# Patient Record
Sex: Female | Born: 1995 | Race: Black or African American | Hispanic: No | Marital: Single | Smoking: Never smoker
Health system: Southern US, Community
[De-identification: ages and names within clinical notes are randomized; demographics above are authoritative.]

## PROBLEM LIST (undated history)

## (undated) DIAGNOSIS — Z789 Other specified health status: Secondary | ICD-10-CM

## (undated) HISTORY — PX: NO PAST SURGERIES: SHX2092

## (undated) HISTORY — DX: Other specified health status: Z78.9

---

## 2016-01-19 ENCOUNTER — Emergency Department
Admission: EM | Admit: 2016-01-19 | Discharge: 2016-01-19 | Disposition: A | Discharge status: Home / Self Care | Payer: BLUE CROSS/BLUE SHIELD | Attending: Emergency Medicine | Admitting: Emergency Medicine

## 2016-01-19 ENCOUNTER — Telehealth: Payer: Self-pay | Admitting: Emergency Medicine

## 2016-01-19 DIAGNOSIS — B373 Candidiasis of vulva and vagina: Secondary | ICD-10-CM | POA: Diagnosis not present

## 2016-01-19 DIAGNOSIS — N898 Other specified noninflammatory disorders of vagina: Secondary | ICD-10-CM | POA: Diagnosis present

## 2016-01-19 DIAGNOSIS — B3731 Acute candidiasis of vulva and vagina: Secondary | ICD-10-CM

## 2016-01-19 LAB — URINALYSIS COMPLETE WITH MICROSCOPIC (ARMC ONLY)
Bilirubin Urine: NEGATIVE
Glucose, UA: NEGATIVE mg/dL
Hgb urine dipstick: NEGATIVE
Ketones, ur: NEGATIVE mg/dL
Nitrite: NEGATIVE
Protein, ur: NEGATIVE mg/dL
Specific Gravity, Urine: 1.004 — ABNORMAL LOW (ref 1.005–1.030)
pH: 6 (ref 5.0–8.0)

## 2016-01-19 LAB — WET PREP, GENITAL
Clue Cells Wet Prep HPF POC: NONE SEEN
Sperm: NONE SEEN
Trich, Wet Prep: NONE SEEN

## 2016-01-19 LAB — CHLAMYDIA/NGC RT PCR (ARMC ONLY)
Chlamydia Tr: DETECTED — AB
N gonorrhoeae: NOT DETECTED

## 2016-01-19 LAB — POCT PREGNANCY, URINE: Preg Test, Ur: NEGATIVE

## 2016-01-19 MED ORDER — FLUCONAZOLE 50 MG PO TABS
150.0000 mg | ORAL_TABLET | Freq: Every day | ORAL | Status: DC
  Administered 2016-01-19: 150 mg via ORAL

## 2016-01-19 MED ORDER — FLUCONAZOLE 100 MG PO TABS
ORAL_TABLET | ORAL | Status: AC
  Filled 2016-01-19: qty 1

## 2016-01-19 MED ORDER — FLUCONAZOLE 150 MG PO TABS: 150.0000 mg | ORAL_TABLET | Freq: Every day | ORAL | Status: DC

## 2016-01-19 MED ORDER — FLUCONAZOLE 50 MG PO TABS
ORAL_TABLET | ORAL | Status: AC
  Filled 2016-01-19: qty 1

## 2016-01-19 NOTE — Discharge Instructions (Signed)
Vaginitis °Vaginitis is an inflammation of the vagina. It is most often caused by a change in the normal balance of the bacteria and yeast that live in the vagina. This change in balance causes an overgrowth of certain bacteria or yeast, which causes the inflammation. There are different types of vaginitis, but the most common types are: °· Bacterial vaginosis. °· Yeast infection (candidiasis). °· Trichomoniasis vaginitis. This is a sexually transmitted infection (STI). °· Viral vaginitis. °· Atrophic vaginitis. °· Allergic vaginitis. °CAUSES  °The cause depends on the type of vaginitis. Vaginitis can be caused by: °· Bacteria (bacterial vaginosis). °· Yeast (yeast infection). °· A parasite (trichomoniasis vaginitis) °· A virus (viral vaginitis). °· Low hormone levels (atrophic vaginitis). Low hormone levels can occur during pregnancy, breastfeeding, or after menopause. °· Irritants, such as bubble baths, scented tampons, and feminine sprays (allergic vaginitis). °Other factors can change the normal balance of the yeast and bacteria that live in the vagina. These include: °· Antibiotic medicines. °· Poor hygiene. °· Diaphragms, vaginal sponges, spermicides, birth control pills, and intrauterine devices (IUD). °· Sexual intercourse. °· Infection. °· Uncontrolled diabetes. °· A weakened immune system. °SYMPTOMS  °Symptoms can vary depending on the cause of the vaginitis. Common symptoms include: °· Abnormal vaginal discharge. °· The discharge is white, gray, or yellow with bacterial vaginosis. °· The discharge is thick, white, and cheesy with a yeast infection. °· The discharge is frothy and yellow or greenish with trichomoniasis. °· A bad vaginal odor. °· The odor is fishy with bacterial vaginosis. °· Vaginal itching, pain, or swelling. °· Painful intercourse. °· Pain or burning when urinating. °Sometimes, there are no symptoms. °TREATMENT  °Treatment will vary depending on the type of infection.  °· Bacterial  vaginosis and trichomoniasis are often treated with antibiotic creams or pills. °· Yeast infections are often treated with antifungal medicines, such as vaginal creams or suppositories. °· Viral vaginitis has no cure, but symptoms can be treated with medicines that relieve discomfort. Your sexual partner should be treated as well. °· Atrophic vaginitis may be treated with an estrogen cream, pill, suppository, or vaginal ring. If vaginal dryness occurs, lubricants and moisturizing creams may help. You may be told to avoid scented soaps, sprays, or douches. °· Allergic vaginitis treatment involves quitting the use of the product that is causing the problem. Vaginal creams can be used to treat the symptoms. °HOME CARE INSTRUCTIONS  °· Take all medicines as directed by your caregiver. °· Keep your genital area clean and dry. Avoid soap and only rinse the area with water. °· Avoid douching. It can remove the healthy bacteria in the vagina. °· Do not use tampons or have sexual intercourse until your vaginitis has been treated. Use sanitary pads while you have vaginitis. °· Wipe from front to back. This avoids the spread of bacteria from the rectum to the vagina. °· Let air reach your genital area. °¨ Wear cotton underwear to decrease moisture buildup. °¨ Avoid wearing underwear while you sleep until your vaginitis is gone. °¨ Avoid tight pants and underwear or nylons without a cotton panel. °¨ Take off wet clothing (especially bathing suits) as soon as possible. °· Use mild, non-scented products. Avoid using irritants, such as: °¨ Scented feminine sprays. °¨ Fabric softeners. °¨ Scented detergents. °¨ Scented tampons. °¨ Scented soaps or bubble baths. °· Practice safe sex and use condoms. Condoms may prevent the spread of trichomoniasis and viral vaginitis. °SEEK MEDICAL CARE IF:  °· You have abdominal pain. °· You   have a fever or persistent symptoms for more than 2-3 days. °· You have a fever and your symptoms suddenly  get worse. °  °This information is not intended to replace advice given to you by your health care provider. Make sure you discuss any questions you have with your health care provider. °  °Document Released: 06/09/2007 Document Revised: 12/27/2014 Document Reviewed: 01/23/2012 °Elsevier Interactive Patient Education ©2016 Elsevier Inc. °Probiotics °WHAT ARE PROBIOTICS? °Probiotics are the good bacteria and yeasts that live in your body and keep you and your digestive system healthy. Probiotics also help your body's defense (immune) system and protect your body against bad bacterial growth.  °Certain foods contain probiotics, such as yogurt. Probiotics can also be purchased as a supplement. As with any supplement or drug, it is important to discuss its use with your health care provider.  °WHAT AFFECTS THE BALANCE OF BACTERIA IN MY BODY? °The balance of bacteria in your body can be affected by:  °· Antibiotic medicines. Antibiotics are sometimes necessary to treat infection. Unfortunately, they may kill good or friendly bacteria in your body as well as the bad bacteria. This may lead to stomach problems like diarrhea, gas, and cramping. °· Disease. Some conditions are the result of an overgrowth of bad bacteria, yeasts, parasites, or fungi. These conditions include:   °¨ Infectious diarrhea. °¨ Stomach and respiratory infections. °¨ Skin infections. °¨ Irritable bowel syndrome (IBS). °¨ Inflammatory bowel diseases. °¨ Ulcer due to Helicobacter pylori (H. pylori) infection. °¨ Tooth decay and periodontal disease. °¨ Vaginal infections. °Stress and poor diet may also lower the good bacteria in your body.  °WHAT TYPE OF PROBIOTIC IS RIGHT FOR ME? °Probiotics are available over the counter at your local pharmacy, health food, or grocery store. They come in many different forms, combinations of strains, and dosing strengths. Some may need to be refrigerated. Always read the label for storage and usage  instructions. °Specific strains have been shown to be more effective for certain conditions. Ask your health care provider what option is best for you.  °WHY WOULD I NEED PROBIOTICS? °There are many reasons your health care provider might recommend a probiotic supplement, including:  °· Diarrhea. °· Constipation. °· IBS. °· Respiratory infections. °· Yeast infections. °· Acne, eczema, and other skin conditions. °· Frequent urinary tract infections (UTIs). °ARE THERE SIDE EFFECTS OF PROBIOTICS? °Some people experience mild side effects when taking probiotics. Side effects are usually temporary and may include:  °· Gas. °· Bloating. °· Cramping. °Rarely, serious side effects, such as infection or immune system changes, may occur. °WHAT ELSE DO I NEED TO KNOW ABOUT PROBIOTICS?  °· There are many different strains of probiotics. Certain strains may be more effective depending on your condition. Probiotics are available in varying doses. Ask your health care provider which probiotic you should use and how often.   °· If you are taking probiotics along with antibiotics, it is generally recommended to wait at least 2 hours between taking the antibiotic and taking the probiotic.   °FOR MORE INFORMATION:  °National Center for Complementary and Alternative Medicine http://nccam.nih.gov/ °  °This information is not intended to replace advice given to you by your health care provider. Make sure you discuss any questions you have with your health care provider. °  °Document Released: 03/09/2014 Document Reviewed: 03/09/2014 °Elsevier Interactive Patient Education ©2016 Elsevier Inc. ° °

## 2016-01-19 NOTE — ED Notes (Signed)
Pt states unable to give urine sample at this time.  Pt requesting water.  PT given water and instructions for giving sample.  Pt instructed to press call bell when able to provide sample

## 2016-01-19 NOTE — ED Provider Notes (Signed)
[facility] Emergency Department Provider Note   ____________________________________________  Time seen: Approximately 222 AM  I have reviewed the triage vital signs and the nursing notes.   HISTORY  Chief Complaint Vaginal Itching    HPI Faith Gonzales is a 20 y.o. female who comes into the hospital today with vaginal irritation. She reports that she's been having these symptoms for the past couple of days but tonight is worse. The patient has been having some vaginal itching burning and swelling. She reports that she has not used anything for the itching. She denies any pain with urination and reports that she has some mild white discharge. The patient has had a UTI before and thought it was initially due to that but reports that when it became worse tonight she came in for evaluation. The patient rates her pain a 4 out of 10 in intensity. She reports that the pain is worse when she is walking and she had unprotected intercourse 7 days ago.   History reviewed. No pertinent past medical history.  There are no active problems to display for this patient.   History reviewed. No pertinent past surgical history.  No current outpatient prescriptions on file.  Allergies Review of patient's allergies indicates no known allergies.  No family history on file.  Social History Social History  Substance Use Topics  . Smoking status: None  . Smokeless tobacco: None  . Alcohol Use: None    Review of Systems Constitutional: No fever/chills Eyes: No visual changes. ENT: No sore throat. Cardiovascular: Denies chest pain. Respiratory: Denies shortness of breath. Gastrointestinal: No abdominal pain.  No nausea, no vomiting.  No diarrhea.  No constipation. Genitourinary: Vaginal discharge Musculoskeletal: Negative for back pain. Skin: Negative for rash. Neurological: Negative for headaches, focal weakness or numbness.  10-point ROS otherwise  negative.  ____________________________________________   PHYSICAL EXAM:  VITAL SIGNS: ED Triage Vitals  Enc Vitals Group     BP 01/19/16 0011 114/43 mmHg     Pulse Rate 01/19/16 0011 86     Resp 01/19/16 0011 18     Temp 01/19/16 0011 98.7 F (37.1 C)     Temp Source 01/19/16 0011 Oral     SpO2 01/19/16 0011 100 %     Weight 01/19/16 0011 155 lb (70.308 kg)     Height 01/19/16 0011  (1.6 m)     Head Cir --      Peak Flow --      Pain Score 01/19/16 0012 5     Pain Loc --      Pain Edu? --      Excl. in GC? --     Constitutional: Alert and oriented. Well appearing and in Mild distress. Eyes: Conjunctivae are normal. PERRL. EOMI. Head: Atraumatic. Nose: No congestion/rhinnorhea. Mouth/Throat: Mucous membranes are moist.  Oropharynx non-erythematous. Cardiovascular: Normal rate, regular rhythm. Grossly normal heart sounds.  Good peripheral circulation. Respiratory: Normal respiratory effort.  No retractions. Lungs CTAB. Gastrointestinal: Soft and nontender. No distention. Positive bowel sounds Genitourinary: Normal external genitalia, no blood in the vaginal vault, white thick discharge visible no cervical motion tenderness nor adnexal tenderness palpation Musculoskeletal: No lower extremity tenderness nor edema.   Neurologic:  Normal speech and language. Skin:  Skin is warm, dry and intact.  Psychiatric: Mood and affect are normal.   ____________________________________________   LABS (all labs ordered are listed, but only abnormal results are displayed)  Labs Reviewed  WET PREP, GENITAL - Abnormal;  Notable for the following:    Yeast Wet Prep HPF POC PRESENT (*)    WBC, Wet Prep HPF POC MANY (*)    All other components within normal limits  URINALYSIS COMPLETEWITH MICROSCOPIC ([facility] ONLY) - Abnormal; Notable for the following:    Color, Urine COLORLESS (*)    APPearance HAZY (*)    Specific Gravity, Urine 1.004 (*)    Leukocytes, UA 3+ (*)    Bacteria, UA  RARE (*)    Squamous Epithelial / LPF 0-5 (*)    All other components within normal limits  CHLAMYDIA/NGC RT PCR ([facility] ONLY)  POCT PREGNANCY, URINE   ____________________________________________  EKG  None ____________________________________________  RADIOLOGY  None ____________________________________________   PROCEDURES  Procedure(s) performed: None  Critical Care performed: No  ____________________________________________   INITIAL IMPRESSION / ASSESSMENT AND PLAN / ED COURSE  Pertinent labs & imaging results that were available during my care of the patient were reviewed by me and considered in my medical decision making (see chart for details).  This is a 20 year old female who comes into the hospital today with some vaginal itching and irritation. The patient did have a wet prep as well as a GC and chlamydia performed. We will also check the patient's urinalysis and pregnancy status. The patient will be reassessed once I received the results of her wet prep.  The patient's wet prep is significant for yeast. Her Trichomonas, clue cells and the remaining of her vaginal wet prep is negative. At this time the patient's GC and chlamydia are pending. The patient will be given some Diflucan and she will be discharged home. She should follow-up with Dr. Elesa MassedWard. ____________________________________________   FINAL CLINICAL IMPRESSION(S) / ED DIAGNOSES  Final diagnoses:  Candidiasis of vagina      NEW MEDICATIONS STARTED DURING THIS VISIT:  New Prescriptions   No medications on file     Note:  This document was prepared using Dragon voice recognition software and may include unintentional dictation errors.    Rebecka ApleyAllison P Hines Kloss, MD 01/19/16 [phone]

## 2016-01-19 NOTE — ED Notes (Addendum)
Called patient to inform her of positive chlamydia test and need for treatment.   Per dr Derrill Kaygoodman can call in azithromycin 1 gram po to pt preferred pharmacy.  Left message asking her to call me.  [phone] called me back .  Azithromycin called to [facility] [address].  Pt understands partner needs treatment as well

## 2016-01-19 NOTE — ED Notes (Signed)
Patient to ED via POV from home for vaginal itching, burning, swelling, and pain. Unable to walk without pain. Admits to unprotected intercourse about 7 days ago.

## 2018-07-29 ENCOUNTER — Telehealth: Payer: Self-pay | Admitting: Obstetrics & Gynecology

## 2018-07-29 NOTE — Telephone Encounter (Signed)
Called left voicemail for patient to call back to be schedule appointment to receiving [facility]

## 2018-08-06 ENCOUNTER — Other Ambulatory Visit (HOSPITAL_COMMUNITY)
Admission: RE | Admit: 2018-08-06 | Discharge: 2018-08-06 | Disposition: A | Discharge status: Home / Self Care | Payer: BLUE CROSS/BLUE SHIELD | Source: Ambulatory Visit | Attending: Obstetrics and Gynecology | Admitting: Obstetrics and Gynecology

## 2018-08-06 ENCOUNTER — Ambulatory Visit (INDEPENDENT_AMBULATORY_CARE_PROVIDER_SITE_OTHER): Payer: BLUE CROSS/BLUE SHIELD | Admitting: Obstetrics and Gynecology

## 2018-08-06 ENCOUNTER — Encounter: Payer: Self-pay | Admitting: Obstetrics and Gynecology

## 2018-08-06 VITALS — BP 132/89 | HR 81 | Wt 164.0 lb

## 2018-08-06 DIAGNOSIS — Z3480 Encounter for supervision of other normal pregnancy, unspecified trimester: Secondary | ICD-10-CM

## 2018-08-06 DIAGNOSIS — Z124 Encounter for screening for malignant neoplasm of cervix: Secondary | ICD-10-CM

## 2018-08-06 DIAGNOSIS — Z3201 Encounter for pregnancy test, result positive: Secondary | ICD-10-CM

## 2018-08-06 DIAGNOSIS — N912 Amenorrhea, unspecified: Secondary | ICD-10-CM

## 2018-08-06 DIAGNOSIS — Z3A01 Less than 8 weeks gestation of pregnancy: Secondary | ICD-10-CM

## 2018-08-06 DIAGNOSIS — Z113 Encounter for screening for infections with a predominantly sexual mode of transmission: Secondary | ICD-10-CM

## 2018-08-06 LAB — POCT URINALYSIS DIPSTICK OB
Glucose, UA: NEGATIVE
POC,PROTEIN,UA: NEGATIVE

## 2018-08-06 LAB — POCT URINE PREGNANCY: Preg Test, Ur: POSITIVE — AB

## 2018-08-06 NOTE — Progress Notes (Signed)
NOB 

## 2018-08-06 NOTE — Progress Notes (Signed)
New Obstetric Patient H&P   Chief Complaint: "Desires prenatal care"   History of Present Illness: Patient is a 22 y.o. G2P0010 Not Hispanic or Latino female, LMP approximately 06/19/2018 presents with amenorrhea and positive home pregnancy test. Based on her  LMP, her EDD is Estimated Date of Delivery: 03/26/19 and her EGA is [redacted]w[redacted]d. Cycles are 4. days, regular, and occur approximately every : 28 days. Her last pap smear was recent, states that it was normal.     She had a urine pregnancy test which was positive 3 week(s)  ago. Her last menstrual period was normal and lasted for  4 or 5 day(s). Since her LMP she claims she has experienced no issues. She denies vaginal bleeding. Her past medical history is noncontributory.   Since her LMP, she admits to the use of tobacco products  no She claims she has gained zero pounds since the start of her pregnancy.  There are cats in the home in the home  no  She admits close contact with children on a regular basis  yes  She has had chicken pox in the past no She has had Tuberculosis exposures, symptoms, or previously tested positive for TB   no Current or past history of domestic violence. no  Genetic Screening/Teratology Counseling: (Includes patient, baby's father, or anyone in either family with:)   1. Patient's age >/= 73 at [facility]  no 2. Thalassemia ([place], [place], Mediterranean, or Asian background): MCV<80  no 3. Neural tube defect (meningomyelocele, spina bifida, anencephaly)  no 4. Congenital heart defect  no  5. Down syndrome  no 6. Tay-Sachs (Jewish, [place]  no 7. Canavan's Disease  no 8. Sickle cell disease or trait (African)  no  9. Hemophilia or other blood disorders  no  10. Muscular dystrophy  no  11. Cystic fibrosis  no  12. Huntington's Chorea  no  13. Mental retardation/autism  no 14. Other inherited genetic or chromosomal disorder  no 15. Maternal metabolic disorder (DM, PKU, etc)  no 16. Patient or FOB with a  child with a birth defect not listed above no  16a. Patient or FOB with a birth defect themselves no 17. Recurrent pregnancy loss, or stillbirth  no  18. Any medications since LMP other than prenatal vitamins (include vitamins, supplements, OTC meds, drugs, alcohol)  no 19. Any other genetic/environmental exposure to discuss  no  Infection History:   1. Lives with someone with TB or TB exposed  no  2. Patient or partner has history of genital herpes  no 3. Rash or viral illness since LMP  no 4. History of STI (GC, CT, HPV, syphilis, HIV)  Chlamydia 2-3 years ago, treated 5. History of recent travel :  no  Other pertinent information:  no     Review of Systems:10 point review of systems negative unless otherwise noted in HPI  Past Medical History:  Diagnosis Date  . No known health problems     Past Surgical History:  Procedure Laterality Date  . NO PAST SURGERIES      Gynecologic History: Patient's last menstrual period was 06/19/2018.  Obstetric History: G2P0010, s/p EAB x 1.  Family History  Problem Relation Age of Onset  . Diabetes Mother   . Prostate cancer Father     Social History   Socioeconomic History  . Marital status: Single    Spouse name: Not on file  . Number of children: Not on file  . Years of education: Not on  file  . Highest education level: Not on file  Occupational History  . Not on file  Social Needs  . Financial resource strain: Not on file  . Food insecurity:    Worry: Not on file    Inability: Not on file  . Transportation needs:    Medical: Not on file    Non-medical: Not on file  Tobacco Use  . Smoking status: Never Smoker  . Smokeless tobacco: Never Used  Substance and Sexual Activity  . Alcohol use: Not Currently  . Drug use: Never  . Sexual activity: Yes  Lifestyle  . Physical activity:    Days per week: Not on file    Minutes per session: Not on file  . Stress: Not on file  Relationships  . Social connections:     Talks on phone: Not on file    Gets together: Not on file    Attends religious service: Not on file    Active member of club or organization: Not on file    Attends meetings of clubs or organizations: Not on file    Relationship status: Not on file  . Intimate partner violence:    Fear of current or ex partner: Not on file    Emotionally abused: Not on file    Physically abused: Not on file    Forced sexual activity: Not on file  Other Topics Concern  . Not on file  Social History Narrative  . Not on file    No Known Allergies  Prior to Admission medications   denies    Physical Exam BP 132/89   Pulse 81   Wt 164 lb (74.4 kg)   LMP 06/19/2018   BMI 29.05 kg/m   Physical Exam Exam conducted with a chaperone present.  Constitutional:      General: She is not in acute distress.    Appearance: Normal appearance. She is not ill-appearing.  HENT:     Head: Normocephalic and atraumatic.  Eyes:     General: No scleral icterus.    Conjunctiva/sclera: Conjunctivae normal.  Neck:     Musculoskeletal: Normal range of motion and neck supple. No muscular tenderness.  Cardiovascular:     Rate and Rhythm: Normal rate and regular rhythm.     Heart sounds: No murmur. No friction rub. No gallop.   Pulmonary:     Effort: Pulmonary effort is normal. No respiratory distress.     Breath sounds: Normal breath sounds. No wheezing or rales.  Abdominal:     General: There is no distension.     Palpations: Abdomen is soft. There is no mass.     Tenderness: There is no abdominal tenderness. There is no guarding or rebound.     Hernia: No hernia is present.  Genitourinary:    Exam position: Lithotomy position.     Pubic Area: No rash.      Labia:        Right: No rash, tenderness, lesion or injury.        Left: No rash, tenderness, lesion or injury.      Vagina: Normal.     Cervix: No cervical motion tenderness, friability, lesion or cervical bleeding.     Uterus: Normal.      Adnexa:  Right adnexa normal and left adnexa normal.       Right: No mass, tenderness or fullness.         Left: No mass, tenderness or fullness.    Musculoskeletal: Normal range  of motion.        General: No swelling.  Skin:    General: Skin is warm and dry.  Neurological:     General: No focal deficit present.     Mental Status: She is oriented to person, place, and time.     Cranial Nerves: No cranial nerve deficit.  Psychiatric:        Mood and Affect: Mood normal.        Behavior: Behavior normal.        Judgment: Judgment normal.      Female Chaperone present during breast and/or pelvic exam.   Assessment: 22 y.o. G1P0 at [redacted]w[redacted]d presenting to initiate prenatal care  Plan: 1) Avoid alcoholic beverages. 2) Patient encouraged not to smoke.  3) Discontinue the use of all non-medicinal drugs and chemicals.  4) Take prenatal vitamins daily.  5) Nutrition, food safety (fish, cheese advisories, and high nitrite foods) and exercise discussed. 6) Hospital and practice style discussed with cross coverage system.  7) Genetic Screening, such as with 1st Trimester Screening, cell free fetal DNA, AFP testing, and Ultrasound, as well as with amniocentesis and CVS as appropriate, is discussed with patient. At the conclusion of today's visit patient undecided genetic testing 8) Patient is asked about travel to areas at risk for the [place] virus, and counseled to avoid travel and exposure to mosquitoes or sexual partners who may have themselves been exposed to the virus. Testing is discussed, and will be ordered as appropriate.   Thomasene Mohair, MD 08/06/2018 4:01 PM

## 2018-08-08 LAB — URINE DRUG PANEL 7
Amphetamines, Urine: NEGATIVE ng/mL
Barbiturate Quant, Ur: NEGATIVE ng/mL
Benzodiazepine Quant, Ur: NEGATIVE ng/mL
Cannabinoid Quant, Ur: NEGATIVE ng/mL
Cocaine (Metab.): NEGATIVE ng/mL
Opiate Quant, Ur: NEGATIVE ng/mL
PCP Quant, Ur: NEGATIVE ng/mL

## 2018-08-08 LAB — URINE CULTURE: Organism ID, Bacteria: NO GROWTH

## 2018-08-10 LAB — RPR+RH+ABO+RUB AB+AB SCR+CB...
Antibody Screen: NEGATIVE
HIV Screen 4th Generation wRfx: NONREACTIVE
Hematocrit: 39.2 % (ref 34.0–46.6)
Hemoglobin: 13.9 g/dL (ref 11.1–15.9)
Hepatitis B Surface Ag: NEGATIVE
MCH: 32.3 pg (ref 26.6–33.0)
MCHC: 35.5 g/dL (ref 31.5–35.7)
MCV: 91 fL (ref 79–97)
Platelets: 230 10*3/uL (ref 150–450)
RBC: 4.31 x10E6/uL (ref 3.77–5.28)
RDW: 12.4 % (ref 12.3–15.4)
RPR Ser Ql: NONREACTIVE
Rh Factor: POSITIVE
Rubella Antibodies, IGG: 14.1 index (ref >0.99)
Varicella zoster IgG: 241 index (ref >165)
WBC: 7.4 10*3/uL (ref 3.4–10.8)

## 2018-08-10 LAB — HEMOGLOBINOPATHY EVALUATION
HGB C: 0 %
HGB S: 0 %
HGB VARIANT: 0 %
Hemoglobin A2 Quantitation: 2.4 % (ref 1.8–3.2)
Hemoglobin F Quantitation: 0 % (ref 0.0–2.0)
Hgb A: 97.6 % (ref 96.4–98.8)

## 2018-08-11 LAB — CYTOLOGY - PAP
Chlamydia: NEGATIVE
Diagnosis: NEGATIVE
Neisseria Gonorrhea: NEGATIVE

## 2018-08-17 ENCOUNTER — Ambulatory Visit (INDEPENDENT_AMBULATORY_CARE_PROVIDER_SITE_OTHER): Payer: BLUE CROSS/BLUE SHIELD

## 2018-08-17 ENCOUNTER — Ambulatory Visit (INDEPENDENT_AMBULATORY_CARE_PROVIDER_SITE_OTHER): Payer: BLUE CROSS/BLUE SHIELD | Admitting: Obstetrics & Gynecology

## 2018-08-17 VITALS — BP 100/60 | Wt 162.0 lb

## 2018-08-17 DIAGNOSIS — Z3480 Encounter for supervision of other normal pregnancy, unspecified trimester: Secondary | ICD-10-CM

## 2018-08-17 DIAGNOSIS — Z3A08 8 weeks gestation of pregnancy: Secondary | ICD-10-CM | POA: Diagnosis not present

## 2018-08-17 DIAGNOSIS — O3481 Maternal care for other abnormalities of pelvic organs, first trimester: Secondary | ICD-10-CM | POA: Diagnosis not present

## 2018-08-17 DIAGNOSIS — N8311 Corpus luteum cyst of right ovary: Secondary | ICD-10-CM | POA: Diagnosis not present

## 2018-08-17 NOTE — Progress Notes (Signed)
  Subjective  Min nausea No pain or VB Objective  BP 100/60   Wt 162 lb (73.5 kg)   LMP 06/19/2018   BMI 28.70 kg/m  General: NAD Pumonary: no increased work of breathing Abdomen: gravid, non-tender Extremities: no edema Psychiatric: mood appropriate, affect full  Assessment  22 y.o. G2P0010 at 6929w3d by  03/26/2019, by Last Menstrual Period presenting for routine prenatal visit  Plan   Problem List Items Addressed This Visit      Other   Supervision of other normal pregnancy, antepartum    Other Visit Diagnoses    [redacted] weeks gestation of pregnancy    -  Primary    Review of ULTRASOUND.    I have personally reviewed images and report of [facility].    Plan of management to be discussed with patient.  Annamarie MajorPaul Chibuikem Thang, MD, Merlinda FrederickFACOG [facility], [facility] 08/17/2018  4:42 PM

## 2018-08-17 NOTE — Patient Instructions (Signed)
First Trimester of Pregnancy  The first trimester of pregnancy is from week 1 until the end of week 13 (months 1 through 3). A week after a sperm fertilizes an egg, the egg will implant on the wall of the uterus. This embryo will begin to develop into a baby. Genes from you and your partner will form the baby. The female genes will determine whether the baby will be a boy or a girl. At 6-8 weeks, the eyes and face will be formed, and the heartbeat can be seen on ultrasound. At the end of 12 weeks, all the baby's organs will be formed.  Now that you are pregnant, you will want to do everything you can to have a healthy baby. Two of the most important things are to get good prenatal care and to follow your health care provider's instructions. Prenatal care is all the medical care you receive before the baby's birth. This care will help prevent, find, and treat any problems during the pregnancy and childbirth.  Body changes during your first trimester  Your body goes through many changes during pregnancy. The changes vary from woman to woman.   You may gain or lose a couple of pounds at first.   You may feel sick to your stomach (nauseous) and you may throw up (vomit). If the vomiting is uncontrollable, call your health care provider.   You may tire easily.   You may develop headaches that can be relieved by medicines. All medicines should be approved by your health care provider.   You may urinate more often. Painful urination may mean you have a bladder infection.   You may develop heartburn as a result of your pregnancy.   You may develop constipation because certain hormones are causing the muscles that push stool through your intestines to slow down.   You may develop hemorrhoids or swollen veins (varicose veins).   Your breasts may begin to grow larger and become tender. Your nipples may stick out more, and the tissue that surrounds them (areola) may become darker.   Your gums may bleed and may be  sensitive to brushing and flossing.   Dark spots or blotches (chloasma, mask of pregnancy) may develop on your face. This will likely fade after the baby is born.   Your menstrual periods will stop.   You may have a loss of appetite.   You may develop cravings for certain kinds of food.   You may have changes in your emotions from day to day, such as being excited to be pregnant or being concerned that something may go wrong with the pregnancy and baby.   You may have more vivid and strange dreams.   You may have changes in your hair. These can include thickening of your hair, rapid growth, and changes in texture. Some women also have hair loss during or after pregnancy, or hair that feels dry or thin. Your hair will most likely return to normal after your baby is born.  What to expect at prenatal visits  During a routine prenatal visit:   You will be weighed to make sure you and the baby are growing normally.   Your blood pressure will be taken.   Your abdomen will be measured to track your baby's growth.   The fetal heartbeat will be listened to between weeks 10 and 14 of your pregnancy.   Test results from any previous visits will be discussed.  Your health care provider may ask you:     How you are feeling.   If you are feeling the baby move.   If you have had any abnormal symptoms, such as leaking fluid, bleeding, severe headaches, or abdominal cramping.   If you are using any tobacco products, including cigarettes, chewing tobacco, and electronic cigarettes.   If you have any questions.  Other tests that may be performed during your first trimester include:   Blood tests to find your blood type and to check for the presence of any previous infections. The tests will also be used to check for low iron levels (anemia) and protein on red blood cells (Rh antibodies). Depending on your risk factors, or if you previously had diabetes during pregnancy, you may have tests to check for high blood sugar  that affects pregnant women (gestational diabetes).   Urine tests to check for infections, diabetes, or protein in the urine.   An ultrasound to confirm the proper growth and development of the baby.   Fetal screens for spinal cord problems (spina bifida) and Down syndrome.   HIV (human immunodeficiency virus) testing. Routine prenatal testing includes screening for HIV, unless you choose not to have this test.   You may need other tests to make sure you and the baby are doing well.  Follow these instructions at home:  Medicines   Follow your health care provider's instructions regarding medicine use. Specific medicines may be either safe or unsafe to take during pregnancy.   Take a prenatal vitamin that contains at least 600 micrograms (mcg) of folic acid.   If you develop constipation, try taking a stool softener if your health care provider approves.  Eating and drinking     Eat a balanced diet that includes fresh fruits and vegetables, whole grains, good sources of protein such as meat, eggs, or tofu, and low-fat dairy. Your health care provider will help you determine the amount of weight gain that is right for you.   Avoid raw meat and uncooked cheese. These carry germs that can cause birth defects in the baby.   Eating four or five small meals rather than three large meals a day may help relieve nausea and vomiting. If you start to feel nauseous, eating a few soda crackers can be helpful. Drinking liquids between meals, instead of during meals, also seems to help ease nausea and vomiting.   Limit foods that are high in fat and processed sugars, such as fried and sweet foods.   To prevent constipation:  ? Eat foods that are high in fiber, such as fresh fruits and vegetables, whole grains, and beans.  ? Drink enough fluid to keep your urine clear or pale yellow.  Activity   Exercise only as directed by your health care provider. Most women can continue their usual exercise routine during  pregnancy. Try to exercise for 30 minutes at least 5 days a week. Exercising will help you:  ? Control your weight.  ? Stay in shape.  ? Be prepared for labor and delivery.   Experiencing pain or cramping in the lower abdomen or lower back is a good sign that you should stop exercising. Check with your health care provider before continuing with normal exercises.   Try to avoid standing for long periods of time. Move your legs often if you must stand in one place for a long time.   Avoid heavy lifting.   Wear low-heeled shoes and practice good posture.   You may continue to have sex unless your health care   provider tells you not to.  Relieving pain and discomfort   Wear a good support bra to relieve breast tenderness.   Take warm sitz baths to soothe any pain or discomfort caused by hemorrhoids. Use hemorrhoid cream if your health care provider approves.   Rest with your legs elevated if you have leg cramps or low back pain.   If you develop varicose veins in your legs, wear support hose. Elevate your feet for 15 minutes, 3-4 times a day. Limit salt in your diet.  Prenatal care   Schedule your prenatal visits by the twelfth week of pregnancy. They are usually scheduled monthly at first, then more often in the last 2 months before delivery.   Write down your questions. Take them to your prenatal visits.   Keep all your prenatal visits as told by your health care provider. This is important.  Safety   Wear your seat belt at all times when driving.   Make a list of emergency phone numbers, including numbers for family, friends, the hospital, and police and fire departments.  General instructions   Ask your health care provider for a referral to a local prenatal education class. Begin classes no later than the beginning of month 6 of your pregnancy.   Ask for help if you have counseling or nutritional needs during pregnancy. Your health care provider can offer advice or refer you to specialists for help  with various needs.   Do not use hot tubs, steam rooms, or saunas.   Do not douche or use tampons or scented sanitary pads.   Do not cross your legs for long periods of time.   Avoid cat litter boxes and soil used by cats. These carry germs that can cause birth defects in the baby and possibly loss of the fetus by miscarriage or stillbirth.   Avoid all smoking, herbs, alcohol, and medicines not prescribed by your health care provider. Chemicals in these products affect the formation and growth of the baby.   Do not use any products that contain nicotine or tobacco, such as cigarettes and e-cigarettes. If you need help quitting, ask your health care provider. You may receive counseling support and other resources to help you quit.   Schedule a dentist appointment. At home, brush your teeth with a soft toothbrush and be gentle when you floss.  Contact a health care provider if:   You have dizziness.   You have mild pelvic cramps, pelvic pressure, or nagging pain in the abdominal area.   You have persistent nausea, vomiting, or diarrhea.   You have a bad smelling vaginal discharge.   You have pain when you urinate.   You notice increased swelling in your face, hands, legs, or ankles.   You are exposed to fifth disease or chickenpox.   You are exposed to German measles (rubella) and have never had it.  Get help right away if:   You have a fever.   You are leaking fluid from your vagina.   You have spotting or bleeding from your vagina.   You have severe abdominal cramping or pain.   You have rapid weight gain or loss.   You vomit blood or material that looks like coffee grounds.   You develop a severe headache.   You have shortness of breath.   You have any kind of trauma, such as from a fall or a car accident.  Summary   The first trimester of pregnancy is from week 1 until   the end of week 13 (months 1 through 3).   Your body goes through many changes during pregnancy. The changes vary from  woman to woman.   You will have routine prenatal visits. During those visits, your health care provider will examine you, discuss any test results you may have, and talk with you about how you are feeling.  This information is not intended to replace advice given to you by your health care provider. Make sure you discuss any questions you have with your health care provider.  Document Released: 08/06/2001 Document Revised: 07/24/2016 Document Reviewed: 07/24/2016  Elsevier Interactive Patient Education  2019 [employer] Inc[employer]

## 2018-09-14 ENCOUNTER — Encounter: Payer: BLUE CROSS/BLUE SHIELD | Admitting: Obstetrics and Gynecology

## 2019-01-15 ENCOUNTER — Encounter: Payer: Self-pay | Admitting: Obstetrics and Gynecology

## 2019-01-15 ENCOUNTER — Ambulatory Visit (INDEPENDENT_AMBULATORY_CARE_PROVIDER_SITE_OTHER): Payer: BLUE CROSS/BLUE SHIELD | Admitting: Obstetrics and Gynecology

## 2019-01-15 ENCOUNTER — Other Ambulatory Visit: Payer: Self-pay

## 2019-01-15 ENCOUNTER — Ambulatory Visit: Payer: BLUE CROSS/BLUE SHIELD | Admitting: Obstetrics and Gynecology

## 2019-01-15 VITALS — BP 110/64 | Wt 170.0 lb

## 2019-01-15 VITALS — Wt 170.0 lb

## 2019-01-15 DIAGNOSIS — Z3480 Encounter for supervision of other normal pregnancy, unspecified trimester: Secondary | ICD-10-CM

## 2019-01-15 DIAGNOSIS — Z113 Encounter for screening for infections with a predominantly sexual mode of transmission: Secondary | ICD-10-CM

## 2019-01-15 DIAGNOSIS — A6009 Herpesviral infection of other urogenital tract: Secondary | ICD-10-CM

## 2019-01-15 MED ORDER — VALACYCLOVIR HCL 1 G PO TABS: 1000.0000 mg | ORAL_TABLET | Freq: Two times a day (BID) | ORAL | 0 refills | Status: DC

## 2019-01-15 NOTE — Progress Notes (Signed)
   Patient ID: Faith Gonzales, female   DOB: [birth year], 23 y.o.   MRN: [id number]  Reason for Consult: Exposure to STD (Pt had a vaginal rash and ulceration about 1 week ago. Went to PCP told her to use Monistat and nothing got better. Would like all STD testing)   Referred by Charlton Amor, MD  Subjective:     HPI:  Faith Gonzales is a 23 y.o. female.  Pt had a vaginal rash and ulceration about 1 week ago. Went to PCP told her to use Monistat and nothing got better. Would like all STD testing   Past Medical History:  Diagnosis Date  . No known health problems    Family History  Problem Relation Age of Onset  . Diabetes Mother   . Prostate cancer Father    Past Surgical History:  Procedure Laterality Date  . NO PAST SURGERIES    . WISDOM TOOTH EXTRACTION  12/09/2019    Short Social History:  Social History   Tobacco Use  . Smoking status: Never Smoker  . Smokeless tobacco: Never Used  Substance Use Topics  . Alcohol use: Not Currently    No Known Allergies  Current Outpatient Medications  Medication Sig Dispense Refill  . docusate sodium (COLACE) 100 MG capsule Take 1 capsule (100 mg total) by mouth 2 (two) times daily as needed. (Patient not taking: Reported on 12/05/2020) 60 capsule 3  . ondansetron (ZOFRAN ODT) 4 MG disintegrating tablet Take 1 tablet (4 mg total) by mouth every 6 (six) hours as needed for nausea. (Patient not taking: Reported on 12/05/2020) 120 tablet 3  . promethazine (PHENERGAN) 25 MG tablet Take 1 tablet (25 mg total) by mouth every 6 (six) hours as needed for nausea or vomiting. (Patient not taking: Reported on 12/05/2020) 120 tablet 3  . valACYclovir (VALTREX) 1000 MG tablet TAKE 1 TABLET (1,000 MG TOTAL) BY MOUTH 2 (TWO) TIMES DAILY FOR 10 DAYS.     No current facility-administered medications for this visit.    Review of Systems  Constitutional: Negative for chills, fatigue, fever and unexpected weight change.  HENT: Negative for  trouble swallowing.  Eyes: Negative for loss of vision.  Respiratory: Negative for cough, shortness of breath and wheezing.  Cardiovascular: Negative for chest pain, leg swelling, palpitations and syncope.  GI: Negative for abdominal pain, blood in stool, diarrhea, nausea and vomiting.  GU: Negative for difficulty urinating, dysuria, frequency and hematuria.  Musculoskeletal: Negative for back pain, leg pain and joint pain.  Skin: Negative for rash.  Neurological: Negative for dizziness, headaches, light-headedness, numbness and seizures.  Psychiatric: Negative for behavioral problem, confusion, depressed mood and sleep disturbance.        Objective:  Objective   Vitals:   01/15/19 0913  BP: 110/64  Weight: 170 lb (77.1 kg)   Body mass index is 30.11 kg/m.  Physical Exam   Small herpes lesion on vulva   Assessment/Plan:    23 yo with genital lesion consistent with herpes rx for valtrex sent STD testing today   Adelene Idler MD [facility], [facility] 01/15/2019 9:35 AM

## 2019-01-15 NOTE — Progress Notes (Signed)
Would just like to check Pt had a vaginal rash and ulceration about a week ago at PCP told her to use monistat and it got worse 110/64

## 2019-01-15 NOTE — Patient Instructions (Addendum)
Birth control Information: [email or web address]   Genital Herpes Genital herpes is a common sexually transmitted infection (STI) that is caused by a virus. The virus spreads from person to person through sexual contact. Infection can cause itching, blisters, and sores around the genitals or rectum. Symptoms may last several days and then go away This is called an outbreak. However, the virus remains in your body, so you may have more outbreaks in the future. The time between outbreaks varies and can be months or years. Genital herpes affects men and women. It is particularly concerning for pregnant women because the virus can be passed to the baby during delivery and can cause serious problems. Genital herpes is also a concern for people who have a weak disease-fighting (immune) system. What are the causes? This condition is caused by the herpes simplex virus (HSV) type 1 or type 2. The virus may spread through:  Sexual contact with an infected person, including vaginal, anal, and oral sex.  Contact with fluid from a herpes sore.  The skin. This means that you can get herpes from an infected partner even if he or she does not have a visible sore or does not know that he or she is infected. What increases the risk? You are more likely to develop this condition if:  You have sex with many partners.  You do not use latex condoms during sex. What are the signs or symptoms? Most people do not have symptoms (asymptomatic) or have mild symptoms that may be mistaken for other skin problems. Symptoms may include:  Small red bumps near the genitals, rectum, or mouth. These bumps turn into blisters and then turn into sores.  Flu-like symptoms, including: ? Fever. ? Body aches. ? Swollen lymph nodes. ? Headache.  Painful urination.  Pain and itching in the genital area or rectal area.  Vaginal discharge.  Tingling or shooting pain in the legs and buttocks. Generally, symptoms are more severe and  last longer during the first (primary) outbreak. Flu-like symptoms are also more common during the primary outbreak. How is this diagnosed? Genital herpes may be diagnosed based on:  A physical exam.  Your medical history.  Blood tests.  A test of a fluid sample (culture) from an open sore. How is this treated? There is no cure for this condition, but treatment with antiviral medicines that are taken by mouth (orally) can do the following:  Speed up healing and relieve symptoms.  Help to reduce the spread of the virus to sexual partners.  Limit the chance of future outbreaks, or make future outbreaks shorter.  Lessen symptoms of future outbreaks. Your health care provider may also recommend pain relief medicines, such as aspirin or ibuprofen. Follow these instructions at home: Sexual activity  Do not have sexual contact during active outbreaks.  Practice safe sex. Latex condoms and female condoms may help prevent the spread of the herpes virus. General instructions  Keep the affected areas dry and clean.  Take over-the-counter and prescription medicines only as told by your health care provider.  Avoid rubbing or touching blisters and sores. If you do touch blisters or sores: ? Wash your hands thoroughly with soap and water. ? Do not touch your eyes afterward.  To help relieve pain or itching, you may take the following actions as directed by your health care provider: ? Apply a cold, wet cloth (cold compress) to affected areas 4-6 times a day. ? Apply a substance that protects your skin and reduces bleeding (  astringent). ? Apply a gel that helps relieve pain around sores (lidocaine gel). ? Take a warm, shallow bath that cleans the genital area (sitz bath).  Keep all follow-up visits as told by your health care provider. This is important. How is this prevented?  Use condoms. Although anyone can get genital herpes during sexual contact, even with the use of a condom, a  condom can provide some protection.  Avoid having multiple sexual partners.  Talk with your sexual partner about any symptoms either of you may have. Also, talk with your partner about any history of STIs.  Get tested for STIs before you have sex. Ask your partner to do the same.  Do not have sexual contact if you have symptoms of genital herpes. Contact a health care provider if:  Your symptoms are not improving with medicine.  Your symptoms return.  You have new symptoms.  You have a fever.  You have abdominal pain.  You have redness, swelling, or pain in your eye.  You notice new sores on other parts of your body.  You are a woman and experience bleeding between menstrual periods.  You have had herpes and you become pregnant or plan to become pregnant. Summary  Genital herpes is a common sexually transmitted infection (STI) that is caused by the herpes simplex virus (HSV) type 1 or type 2.  These viruses are most often spread through sexual contact with an infected person.  You are more likely to develop this condition if you have sex with many partners or you have unprotected sex.  Most people do not have symptoms (asymptomatic) or have mild symptoms that may be mistaken for other skin problems. Symptoms occur as outbreaks that may happen months or years apart.  There is no cure for this condition, but treatment with oral antiviral medicines can reduce symptoms, reduce the chance of spreading the virus to a partner, prevent future outbreaks, or shorten future outbreaks. This information is not intended to replace advice given to you by your health care provider. Make sure you discuss any questions you have with your health care provider. Document Released: 08/09/2000 Document Revised: 07/12/2016 Document Reviewed: 07/12/2016 Elsevier Interactive Patient Education  2019 [employer].   Safe Sex Practicing safe sex means taking steps before and during sex to reduce  your risk of:  Getting an STD (sexually transmitted disease).  Giving your partner an STD.  Unwanted pregnancy. How can I practice safe sex? To practice safe sex:  Limit your sexual partners to only one partner who is having sex with only you.  Avoid using alcohol and recreational drugs before having sex. These substances can affect your judgment.  Before having sex with a new partner: ? Talk to your partner about past partners, past STDs, and drug use. ? You and your partner should be screened for STDs and discuss the results with each other.  Check your body regularly for sores, blisters, rashes, or unusual discharge. If you notice any of these problems, visit your health care provider.  If you have symptoms of an infection or you are being treated for an STD, avoid sexual contact.  While having sex, use a condom. Make sure to: ? Use a condom every time you have vaginal, oral, or anal sex. Both females and males should wear condoms during oral sex. ? Keep condoms in place from the beginning to the end of sexual activity. ? Use a latex condom, if possible. Latex condoms offer the best protection. ?  Use only water-based lubricants or oils to lubricate a condom. Using petroleum-based lubricants or oils will weaken the condom and increase the chance that it will break.  See your health care provider for regular screenings, exams, and tests for STDs.  Talk with your health care provider about the form of birth control (contraception) that is best for you.  Get vaccinated against hepatitis B and human papillomavirus (HPV).  If you are at risk of being infected with HIV (human immunodeficiency virus), talk with your health care provider about taking a prescription medicine to prevent HIV infection. You are considered at risk for HIV if: ? You are a man who has sex with other men. ? You are a heterosexual man or woman who is sexually active with more than one partner. ? You take drugs  by injection. ? You are sexually active with a partner who has HIV. This information is not intended to replace advice given to you by your health care provider. Make sure you discuss any questions you have with your health care provider. Document Released: 09/19/2004 Document Revised: 12/27/2015 Document Reviewed: 07/02/2015 Elsevier Interactive Patient Education  2019 [employer].

## 2019-01-19 LAB — HERPES SIMPLEX VIRUS CULTURE

## 2019-01-20 LAB — HSV(HERPES SMPLX)ABS-I+II(IGG+IGM)-BLD
HSV 1 Glycoprotein G Ab, IgG: 1.93 index — ABNORMAL HIGH (ref 0.00–0.90)
HSV 2 IgG, Type Spec: <0.91 index (ref 0.00–0.90)
HSVI/II Comb IgM: 1.7 Ratio — ABNORMAL HIGH (ref 0.00–0.90)

## 2019-01-20 LAB — HEP, RPR, HIV PANEL
HIV Screen 4th Generation wRfx: NONREACTIVE
Hepatitis B Surface Ag: NEGATIVE
RPR Ser Ql: NONREACTIVE

## 2019-01-21 NOTE — Progress Notes (Signed)
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 She is feeling better on the medication.

## 2019-01-26 ENCOUNTER — Telehealth: Payer: Self-pay

## 2019-01-26 ENCOUNTER — Other Ambulatory Visit: Payer: Self-pay

## 2019-01-26 DIAGNOSIS — A6009 Herpesviral infection of other urogenital tract: Secondary | ICD-10-CM

## 2019-01-26 MED ORDER — VALACYCLOVIR HCL 1 G PO TABS: 1000.0000 mg | ORAL_TABLET | Freq: Two times a day (BID) | ORAL | 0 refills | Status: DC

## 2019-01-26 NOTE — Telephone Encounter (Signed)
Pt states [facility] had called in Valtrex for her in the past. She is currently having an outbreak of cold sores and would like a refill.   I did not see this on her medication list to refill. Please advise

## 2019-01-26 NOTE — Telephone Encounter (Signed)
Rx sent 

## 2019-01-26 NOTE — Telephone Encounter (Signed)
Okay to refill? 

## 2019-01-28 LAB — NUSWAB VAGINITIS PLUS (VG+)
Candida albicans, NAA: NEGATIVE
Candida glabrata, NAA: NEGATIVE
Chlamydia trachomatis, NAA: NEGATIVE
Neisseria gonorrhoeae, NAA: NEGATIVE
Trich vag by NAA: NEGATIVE

## 2019-01-29 NOTE — Progress Notes (Signed)
Negative, Released to mychart 

## 2019-10-01 ENCOUNTER — Other Ambulatory Visit: Payer: Self-pay | Admitting: Obstetrics and Gynecology

## 2019-10-01 DIAGNOSIS — A6009 Herpesviral infection of other urogenital tract: Secondary | ICD-10-CM

## 2019-12-09 HISTORY — PX: WISDOM TOOTH EXTRACTION: SHX21

## 2020-02-11 ENCOUNTER — Other Ambulatory Visit: Payer: Self-pay

## 2020-02-11 ENCOUNTER — Ambulatory Visit (LOCAL_COMMUNITY_HEALTH_CENTER): Payer: Self-pay

## 2020-02-11 DIAGNOSIS — Z111 Encounter for screening for respiratory tuberculosis: Secondary | ICD-10-CM

## 2020-02-14 ENCOUNTER — Ambulatory Visit (LOCAL_COMMUNITY_HEALTH_CENTER): Payer: Self-pay

## 2020-02-14 ENCOUNTER — Other Ambulatory Visit: Payer: Self-pay

## 2020-02-14 DIAGNOSIS — Z111 Encounter for screening for respiratory tuberculosis: Secondary | ICD-10-CM

## 2020-02-14 LAB — TB SKIN TEST
Induration: 0 mm
TB Skin Test: NEGATIVE

## 2020-05-02 ENCOUNTER — Emergency Department: Payer: BC Managed Care – PPO

## 2020-05-02 ENCOUNTER — Encounter: Payer: Self-pay | Admitting: *Deleted

## 2020-05-02 ENCOUNTER — Emergency Department
Admission: EM | Admit: 2020-05-02 | Discharge: 2020-05-02 | Disposition: A | Discharge status: Home / Self Care | Payer: BC Managed Care – PPO | Attending: Emergency Medicine | Admitting: Emergency Medicine

## 2020-05-02 ENCOUNTER — Other Ambulatory Visit: Payer: Self-pay

## 2020-05-02 DIAGNOSIS — R0781 Pleurodynia: Secondary | ICD-10-CM | POA: Insufficient documentation

## 2020-05-02 DIAGNOSIS — Z5321 Procedure and treatment not carried out due to patient leaving prior to being seen by health care provider: Secondary | ICD-10-CM | POA: Insufficient documentation

## 2020-05-02 NOTE — ED Notes (Signed)
No answer when called several times from lobby & outside 

## 2020-05-02 NOTE — ED Triage Notes (Signed)
Pt ambulatory to triage.  Pt reports pain in rib area.  Anterior rib pain.  States it hurts from the inside out.  No chest pain or  sob.  No back pain.

## 2020-07-23 ENCOUNTER — Emergency Department
Admission: EM | Admit: 2020-07-23 | Discharge: 2020-07-23 | Disposition: A | Discharge status: Home / Self Care | Payer: BC Managed Care – PPO | Attending: Emergency Medicine | Admitting: Emergency Medicine

## 2020-07-23 ENCOUNTER — Other Ambulatory Visit: Payer: Self-pay

## 2020-07-23 DIAGNOSIS — Y92410 Unspecified street and highway as the place of occurrence of the external cause: Secondary | ICD-10-CM | POA: Insufficient documentation

## 2020-07-23 DIAGNOSIS — M545 Low back pain, unspecified: Secondary | ICD-10-CM | POA: Diagnosis present

## 2020-07-23 MED ORDER — MELOXICAM 15 MG PO TABS: 15.0000 mg | ORAL_TABLET | Freq: Every day | ORAL | 2 refills | Status: DC

## 2020-07-23 MED ORDER — METHOCARBAMOL 500 MG PO TABS: 500.0000 mg | ORAL_TABLET | Freq: Three times a day (TID) | ORAL | 0 refills | Status: AC | PRN

## 2020-07-23 NOTE — Discharge Instructions (Addendum)
Take meloxicam and Robaxin as directed. 
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
## 2020-07-29 ENCOUNTER — Other Ambulatory Visit: Payer: Self-pay | Admitting: Obstetrics and Gynecology

## 2020-07-29 DIAGNOSIS — A6009 Herpesviral infection of other urogenital tract: Secondary | ICD-10-CM

## 2020-08-28 ENCOUNTER — Encounter: Payer: Self-pay | Admitting: Obstetrics and Gynecology

## 2020-08-28 ENCOUNTER — Other Ambulatory Visit: Payer: Self-pay

## 2020-08-28 ENCOUNTER — Other Ambulatory Visit (HOSPITAL_COMMUNITY)
Admission: RE | Admit: 2020-08-28 | Discharge: 2020-08-28 | Disposition: A | Discharge status: Home / Self Care | Payer: BC Managed Care – PPO | Source: Ambulatory Visit | Attending: Obstetrics and Gynecology | Admitting: Obstetrics and Gynecology

## 2020-08-28 ENCOUNTER — Ambulatory Visit (INDEPENDENT_AMBULATORY_CARE_PROVIDER_SITE_OTHER): Payer: BC Managed Care – PPO | Admitting: Obstetrics and Gynecology

## 2020-08-28 VITALS — BP 98/56 | HR 64 | Wt 147.0 lb

## 2020-08-28 DIAGNOSIS — Z113 Encounter for screening for infections with a predominantly sexual mode of transmission: Secondary | ICD-10-CM

## 2020-08-28 DIAGNOSIS — Z130 Encounter for screening for diseases of the blood and blood-forming organs and certain disorders involving the immune mechanism: Secondary | ICD-10-CM

## 2020-08-28 DIAGNOSIS — U071 COVID-19: Secondary | ICD-10-CM

## 2020-08-28 DIAGNOSIS — O98511 Other viral diseases complicating pregnancy, first trimester: Secondary | ICD-10-CM

## 2020-08-28 DIAGNOSIS — Z3480 Encounter for supervision of other normal pregnancy, unspecified trimester: Secondary | ICD-10-CM

## 2020-08-28 DIAGNOSIS — Z3A01 Less than 8 weeks gestation of pregnancy: Secondary | ICD-10-CM

## 2020-08-28 DIAGNOSIS — O219 Vomiting of pregnancy, unspecified: Secondary | ICD-10-CM

## 2020-08-28 DIAGNOSIS — N912 Amenorrhea, unspecified: Secondary | ICD-10-CM

## 2020-08-28 LAB — POCT URINALYSIS DIPSTICK OB
Appearance: NORMAL
Bilirubin, UA: NEGATIVE
Blood, UA: NEGATIVE
Glucose, UA: NEGATIVE
Ketones, UA: NEGATIVE
Leukocytes, UA: NEGATIVE
Nitrite, UA: NEGATIVE
POC,PROTEIN,UA: NEGATIVE
Spec Grav, UA: 1.015 (ref 1.010–1.025)
Urobilinogen, UA: 0.2 E.U./dL
pH, UA: 6 (ref 5.0–8.0)

## 2020-08-28 LAB — OB RESULTS CONSOLE GC/CHLAMYDIA: Gonorrhea: NEGATIVE

## 2020-08-28 MED ORDER — ONDANSETRON 4 MG PO TBDP: 4.0000 mg | ORAL_TABLET | Freq: Four times a day (QID) | ORAL | 3 refills | Status: DC | PRN

## 2020-08-28 MED ORDER — PROMETHAZINE HCL 25 MG PO TABS: 25.0000 mg | ORAL_TABLET | Freq: Four times a day (QID) | ORAL | 3 refills | Status: DC | PRN

## 2020-08-28 MED ORDER — DOCUSATE SODIUM 100 MG PO CAPS: 100.0000 mg | ORAL_CAPSULE | Freq: Two times a day (BID) | ORAL | 3 refills | Status: DC | PRN

## 2020-08-28 NOTE — Patient Instructions (Addendum)
Initial steps to help :   B6 (pyridoxine) 25 mg,  3-4 times a day- 200 mg a day total Unisom (doxylamine) 25 mg at bedtime **B6 and Unisom are available as a combination prescription medications called diclegis and bonjesta  B1 (thiamin)  50-100 mg 1-2 a day-  100 mg a day total  Continue prenatal vitamin with iron and thiamin. If it is not tolerated switch to 1 mg of folic acid.  Can add medication for gastric reflux if needed.  Subsequent steps to be added to B1, B6, and Unisom:  1. Antihistamine (one of the following medications) Dramamine      25-50 mg every 4-6 hours Benadryl      25-50 mg every 4-6 hours Meclizine      25 mg every 6 hours  2. Dopamine Antagonist (one of the following medications) Metoclopramide  (Reglan)  5-10 mg every 6-8 hours         PO Promethazine   (Phenergan)   12.5-25 mg every 4-6 hours      PO or rectal Prochlorperazine  (Compazine)  5-10 mg every 6-8 hours     25mg  BID rectally   Subsequent steps if there has still not been improvement in symptoms:  3. Daily stool softner:  Colace 100 mg twice a day  4. Ondansetron  (Zofran)   4-8 mg every 6-8 hours      First Trimester of Pregnancy The first trimester of pregnancy is from week 1 until the end of week 13 (months 1 through 3). A week after a sperm fertilizes an egg, the egg will implant on the wall of the uterus. This embryo will begin to develop into a baby. Genes from you and your partner will form the baby. The female genes will determine whether the baby will be a boy or a girl. At 6-8 weeks, the eyes and face will be formed, and the heartbeat can be seen on ultrasound. At the end of 12 weeks, all the baby's organs will be formed. Now that you are pregnant, you will want to do everything you can to have a healthy baby. Two of the most important things are to get good prenatal care and to follow your health care provider's instructions. Prenatal care is all the medical care you receive before the  baby's birth. This care will help prevent, find, and treat any problems during the pregnancy and childbirth. Body changes during your first trimester Your body goes through many changes during pregnancy. The changes vary from woman to woman.  You may gain or lose a couple of pounds at first.  You may feel sick to your stomach (nauseous) and you may throw up (vomit). If the vomiting is uncontrollable, call your health care provider.  You may tire easily.  You may develop headaches that can be relieved by medicines. All medicines should be approved by your health care provider.  You may urinate more often. Painful urination may mean you have a bladder infection.  You may develop heartburn as a result of your pregnancy.  You may develop constipation because certain hormones are causing the muscles that push stool through your intestines to slow down.  You may develop hemorrhoids or swollen veins (varicose veins).  Your breasts may begin to grow larger and become tender. Your nipples may stick out more, and the tissue that surrounds them (areola) may become darker.  Your gums may bleed and may be sensitive to brushing and flossing.  Dark spots or blotches (  chloasma, mask of pregnancy) may develop on your face. This will likely fade after the baby is born.  Your menstrual periods will stop.  You may have a loss of appetite.  You may develop cravings for certain kinds of food.  You may have changes in your emotions from day to day, such as being excited to be pregnant or being concerned that something may go wrong with the pregnancy and baby.  You may have more vivid and strange dreams.  You may have changes in your hair. These can include thickening of your hair, rapid growth, and changes in texture. Some women also have hair loss during or after pregnancy, or hair that feels dry or thin. Your hair will most likely return to normal after your baby is born. What to expect at prenatal  visits During a routine prenatal visit:  You will be weighed to make sure you and the baby are growing normally.  Your blood pressure will be taken.  Your abdomen will be measured to track your baby's growth.  The fetal heartbeat will be listened to between weeks 10 and 14 of your pregnancy.  Test results from any previous visits will be discussed. Your health care provider may ask you:  How you are feeling.  If you are feeling the baby move.  If you have had any abnormal symptoms, such as leaking fluid, bleeding, severe headaches, or abdominal cramping.  If you are using any tobacco products, including cigarettes, chewing tobacco, and electronic cigarettes.  If you have any questions. Other tests that may be performed during your first trimester include:  Blood tests to find your blood type and to check for the presence of any previous infections. The tests will also be used to check for low iron levels (anemia) and protein on red blood cells (Rh antibodies). Depending on your risk factors, or if you previously had diabetes during pregnancy, you may have tests to check for high blood sugar that affects pregnant women (gestational diabetes).  Urine tests to check for infections, diabetes, or protein in the urine.  An ultrasound to confirm the proper growth and development of the baby.  Fetal screens for spinal cord problems (spina bifida) and Down syndrome.  HIV (human immunodeficiency virus) testing. Routine prenatal testing includes screening for HIV, unless you choose not to have this test.  You may need other tests to make sure you and the baby are doing well. Follow these instructions at home: Medicines  Follow your health care provider's instructions regarding medicine use. Specific medicines may be either safe or unsafe to take during pregnancy.  Take a prenatal vitamin that contains at least 600 micrograms (mcg) of folic acid.  If you develop constipation, try  taking a stool softener if your health care provider approves. Eating and drinking   Eat a balanced diet that includes fresh fruits and vegetables, whole grains, good sources of protein such as meat, eggs, or tofu, and low-fat dairy. Your health care provider will help you determine the amount of weight gain that is right for you.  Avoid raw meat and uncooked cheese. These carry germs that can cause birth defects in the baby.  Eating four or five small meals rather than three large meals a day may help relieve nausea and vomiting. If you start to feel nauseous, eating a few soda crackers can be helpful. Drinking liquids between meals, instead of during meals, also seems to help ease nausea and vomiting.  Limit foods that are high  in fat and processed sugars, such as fried and sweet foods.  To prevent constipation: ? Eat foods that are high in fiber, such as fresh fruits and vegetables, whole grains, and beans. ? Drink enough fluid to keep your urine clear or pale yellow. Activity  Exercise only as directed by your health care provider. Most women can continue their usual exercise routine during pregnancy. Try to exercise for 30 minutes at least 5 days a week. Exercising will help you: ? Control your weight. ? Stay in shape. ? Be prepared for labor and delivery.  Experiencing pain or cramping in the lower abdomen or lower back is a good sign that you should stop exercising. Check with your health care provider before continuing with normal exercises.  Try to avoid standing for long periods of time. Move your legs often if you must stand in one place for a long time.  Avoid heavy lifting.  Wear low-heeled shoes and practice good posture.  You may continue to have sex unless your health care provider tells you not to. Relieving pain and discomfort  Wear a good support bra to relieve breast tenderness.  Take warm sitz baths to soothe any pain or discomfort caused by hemorrhoids. Use  hemorrhoid cream if your health care provider approves.  Rest with your legs elevated if you have leg cramps or low back pain.  If you develop varicose veins in your legs, wear support hose. Elevate your feet for 15 minutes, 3-4 times a day. Limit salt in your diet. Prenatal care  Schedule your prenatal visits by the twelfth week of pregnancy. They are usually scheduled monthly at first, then more often in the last 2 months before delivery.  Write down your questions. Take them to your prenatal visits.  Keep all your prenatal visits as told by your health care provider. This is important. Safety  Wear your seat belt at all times when driving.  Make a list of emergency phone numbers, including numbers for family, friends, the hospital, and police and fire departments. General instructions  Ask your health care provider for a referral to a local prenatal education class. Begin classes no later than the beginning of month 6 of your pregnancy.  Ask for help if you have counseling or nutritional needs during pregnancy. Your health care provider can offer advice or refer you to specialists for help with various needs.  Do not use hot tubs, steam rooms, or saunas.  Do not douche or use tampons or scented sanitary pads.  Do not cross your legs for long periods of time.  Avoid cat litter boxes and soil used by cats. These carry germs that can cause birth defects in the baby and possibly loss of the fetus by miscarriage or stillbirth.  Avoid all smoking, herbs, alcohol, and medicines not prescribed by your health care provider. Chemicals in these products affect the formation and growth of the baby.  Do not use any products that contain nicotine or tobacco, such as cigarettes and e-cigarettes. If you need help quitting, ask your health care provider. You may receive counseling support and other resources to help you quit.  Schedule a dentist appointment. At home, brush your teeth with a soft  toothbrush and be gentle when you floss. Contact a health care provider if:  You have dizziness.  You have mild pelvic cramps, pelvic pressure, or nagging pain in the abdominal area.  You have persistent nausea, vomiting, or diarrhea.  You have a bad smelling vaginal discharge.  You have pain when you urinate.  You notice increased swelling in your face, hands, legs, or ankles.  You are exposed to fifth disease or chickenpox.  You are exposed to [place] measles (rubella) and have never had it. Get help right away if:  You have a fever.  You are leaking fluid from your vagina.  You have spotting or bleeding from your vagina.  You have severe abdominal cramping or pain.  You have rapid weight gain or loss.  You vomit blood or material that looks like coffee grounds.  You develop a severe headache.  You have shortness of breath.  You have any kind of trauma, such as from a fall or a car accident. Summary  The first trimester of pregnancy is from week 1 until the end of week 13 (months 1 through 3).  Your body goes through many changes during pregnancy. The changes vary from woman to woman.  You will have routine prenatal visits. During those visits, your health care provider will examine you, discuss any test results you may have, and talk with you about how you are feeling. This information is not intended to replace advice given to you by your health care provider. Make sure you discuss any questions you have with your health care provider. Document Revised: 07/25/2017 Document Reviewed: 07/24/2016 Elsevier Patient Education  2020 [employer]   Hyperemesis Gravidarum Hyperemesis gravidarum is a severe form of nausea and vomiting that happens during pregnancy. Hyperemesis is worse than morning sickness. It may cause you to have nausea or vomiting all day for many days. It may keep you from eating and drinking enough food and liquids, which can lead to dehydration,  malnutrition, and weight loss. Hyperemesis usually occurs during the first half (the first 20 weeks) of pregnancy. It often goes away once a woman is in her second half of pregnancy. However, sometimes hyperemesis continues through an entire pregnancy. What are the causes? The cause of this condition is not known. It may be related to changes in chemicals (hormones) in the body during pregnancy, such as the high level of pregnancy hormone (human chorionic gonadotropin) or the increase in the female sex hormone (estrogen). What are the signs or symptoms? Symptoms of this condition include:  Nausea that does not go away.  Vomiting that does not allow you to keep any food down.  Weight loss.  Body fluid loss (dehydration).  Having no desire to eat, or not liking food that you have previously enjoyed. How is this diagnosed? This condition may be diagnosed based on:  A physical exam.  Your medical history.  Your symptoms.  Blood tests.  Urine tests. How is this treated? This condition is managed by controlling symptoms. This may include:  Following an eating plan. This can help lessen nausea and vomiting.  Taking prescription medicines. An eating plan and medicines are often used together to help control symptoms. If medicines do not help relieve nausea and vomiting, you may need to receive fluids through an IV at the hospital. Follow these instructions at home: Eating and drinking   Avoid the following: ? Drinking fluids with meals. Try not to drink anything during the 30 minutes before and after your meals. ? Drinking more than 1 cup of fluid at a time. ? Eating foods that trigger your symptoms. These may include spicy foods, coffee, high-fat foods, very sweet foods, and acidic foods. ? Skipping meals. Nausea can be more intense on an empty stomach. If you cannot tolerate  food, do not force it. Try sucking on ice chips or other frozen items and make up for missed calories  later. ? Lying down within 2 hours after eating. ? Being exposed to environmental triggers. These may include food smells, smoky rooms, closed spaces, rooms with strong smells, warm or humid places, overly loud and noisy rooms, and rooms with motion or flickering lights. Try eating meals in a well-ventilated area that is free of strong smells. ? Quick and sudden changes in your movement. ? Taking iron pills and multivitamins that contain iron. If you take prescription iron pills, do not stop taking them unless your health care provider approves. ? Preparing food. The smell of food can spoil your appetite or trigger nausea.  To help relieve your symptoms: ? Listen to your body. Everyone is different and has different preferences. Find what works best for you. ? Eat and drink slowly. ? Eat 5-6 small meals daily instead of 3 large meals. Eating small meals and snacks can help you avoid an empty stomach. ? In the morning, before getting out of bed, eat a couple of crackers to avoid moving around on an empty stomach. ? Try eating starchy foods as these are usually tolerated well. Examples include cereal, toast, bread, potatoes, pasta, rice, and pretzels. ? Include at least 1 serving of protein with your meals and snacks. Protein options include lean meats, poultry, seafood, beans, nuts, nut butters, eggs, cheese, and yogurt. ? Try eating a protein-rich snack before bed. Examples of a protein-rick snack include cheese and crackers or a peanut butter sandwich made with 1 slice of whole-wheat bread and 1 tsp (5 g) of peanut butter. ? Eat or suck on things that have ginger in them. It may help relieve nausea. Add  tsp ground ginger to hot tea or choose ginger tea. ? Try drinking 100% fruit juice or an electrolyte drink. An electrolyte drink contains sodium, potassium, and chloride. ? Drink fluids that are cold, clear, and carbonated or sour. Examples include lemonade, ginger ale, lemon-lime soda, ice water,  and sparkling water. ? Brush your teeth or use a mouth rinse after meals. ? Talk with your health care provider about starting a supplement of vitamin B6. General instructions  Take over-the-counter and prescription medicines only as told by your health care provider.  Follow instructions from your health care provider about eating or drinking restrictions.  Continue to take your prenatal vitamins as told by your health care provider. If you are having trouble taking your prenatal vitamins, talk with your health care provider about different options.  Keep all follow-up and pre-birth (prenatal) visits as told by your health care provider. This is important. Contact a health care provider if:  You have pain in your abdomen.  You have a severe headache.  You have vision problems.  You are losing weight.  You feel weak or dizzy. Get help right away if:  You cannot drink fluids without vomiting.  You vomit blood.  You have constant nausea and vomiting.  You are very weak.  You faint.  You have a fever and your symptoms suddenly get worse. Summary  Hyperemesis gravidarum is a severe form of nausea and vomiting that happens during pregnancy.  Making some changes to your eating habits may help relieve nausea and vomiting.  This condition may be managed with medicine.  If medicines do not help relieve nausea and vomiting, you may need to receive fluids through an IV at the hospital. This information is  not intended to replace advice given to you by your health care provider. Make sure you discuss any questions you have with your health care provider. Document Revised: 09/01/2017 Document Reviewed: 04/10/2016 Elsevier Patient Education  2020 [employer]  

## 2020-08-28 NOTE — Progress Notes (Signed)
08/28/2020   Chief Complaint: Missed period  Transfer of Care Patient: no  History of Present Illness: [name] is a 25 y.o. G3P0020 [redacted]w[redacted]d based on Patient's last menstrual period was 07/13/2020. with an Estimated Date of Delivery: 04/19/21, with the above CC.   Her periods were: regular periods every month. She was using no method when she conceived.  She has positivie signs or symptoms of nausea/vomiting of pregnancy. Reports vomiting once a day. 2 lbs weight loss reported.  She has Negative signs or symptoms of miscarriage or preterm labor She was not taking different medications around the time she conceived/early pregnancy. Since her LMP, she has used alcohol Since her LMP, she has not used tobacco products Since her LMP, she has not used illegal drugs.    Current or past history of domestic violence. no  Infection History:  1. Since her LMP, she has had a viral illness.  Covid + 08/16/2020, still having nausea since the illness.  2. She denies close contact with children on a regular basis     3. She denies a history of chicken pox. She denies vaccination for chicken pox in the past. 4. Patient or partner has history of genital herpes  yes 5. History of STI (GC, CT, HPV, syphilis, HIV)  no   6.  She does live with someone with TB or TB exposed. 7. History of recent travel :  no 8. She identifies Negative Zika risk factors for her and her partner 57. There are not cats in the home in the home.  She understands that while pregnant she should not change cat litter.   Genetic Screening Questions: (Includes patient, baby's father, or anyone in either family)   1. Patient's age >/= 44 at [place]  no 2. Thalassemia ([place], [place], Mediterranean, or Asian background): MCV<80  no 3. Neural tube defect (meningomyelocele, spina bifida, anencephaly)  no 4. Congenital heart defect  no  5. Down syndrome  no 6. Tay-Sachs (Jewish, [place]  no 7. Canavan's Disease  no 8. Sickle cell  disease or trait (African)  no  9. Hemophilia or other blood disorders  no  10. Muscular dystrophy  no  11. Cystic fibrosis  no  12. Huntington's Chorea  no  13. Mental retardation/autism  no 14. Other inherited genetic or chromosomal disorder  no 15. Maternal metabolic disorder (DM, PKU, etc)  no 16. Patient or FOB with a child with a birth defect not listed above no  16a. Patient or FOB with a birth defect themselves no 17. Recurrent pregnancy loss, or stillbirth  no  18. Any medications since LMP other than prenatal vitamins (include vitamins, supplements, OTC meds, drugs, alcohol)  no 19. Any other genetic/environmental exposure to discuss  no  ROS:  Review of Systems  Constitutional: Positive for weight loss. Negative for chills, fever and malaise/fatigue.  HENT: Negative for congestion, hearing loss and sinus pain.   Eyes: Negative for blurred vision and double vision.  Respiratory: Positive for cough. Negative for sputum production, shortness of breath and wheezing.   Cardiovascular: Negative for chest pain, palpitations, orthopnea and leg swelling.  Gastrointestinal: Positive for nausea and vomiting. Negative for abdominal pain, constipation and diarrhea.  Genitourinary: Negative for dysuria, flank pain, frequency, hematuria and urgency.  Musculoskeletal: Negative for back pain, falls and joint pain.  Skin: Negative for itching and rash.  Neurological: Positive for headaches. Negative for dizziness.  Psychiatric/Behavioral: Negative for depression, substance abuse and suicidal ideas. The patient is not  nervous/anxious.     OBGYN History: As per HPI. OB History  Gravida Para Term Preterm AB Living  3       2    SAB IAB Ectopic Multiple Live Births    1          # Outcome Date GA Lbr Len/2nd Weight Sex Delivery Anes PTL Lv  3 Current           2 AB 02/05/17          1 IAB  [redacted]w[redacted]d           Any issues with any prior pregnancies: no Any prior children are healthy, doing  well, without any problems or issues: not applicable Last pap smear 2019 NIL  History of STIs: Yes   Past Medical History: Past Medical History:  Diagnosis Date  . No known health problems     Past Surgical History: Past Surgical History:  Procedure Laterality Date  . NO PAST SURGERIES    . WISDOM TOOTH EXTRACTION  12/09/2019    Family History:  Family History  Problem Relation Age of Onset  . Diabetes Mother   . Prostate cancer Father    She denies any female cancers, bleeding or blood clotting disorders.   Social History:  Social History   Socioeconomic History  . Marital status: Single    Spouse name: Not on file  . Number of children: Not on file  . Years of education: Not on file  . Highest education level: Not on file  Occupational History  . Not on file  Tobacco Use  . Smoking status: Never Smoker  . Smokeless tobacco: Never Used  Vaping Use  . Vaping Use: Never used  Substance and Sexual Activity  . Alcohol use: Not Currently  . Drug use: Never  . Sexual activity: Yes  Other Topics Concern  . Not on file  Social History Narrative  . Not on file   Social Determinants of Health   Financial Resource Strain: Not on file  Food Insecurity: Not on file  Transportation Needs: Not on file  Physical Activity: Not on file  Stress: Not on file  Social Connections: Not on file  Intimate Partner Violence: Not on file    Allergy: No Known Allergies  Current Outpatient Medications:  Current Outpatient Medications:  .  docusate sodium (COLACE) 100 MG capsule, Take 1 capsule (100 mg total) by mouth 2 (two) times daily as needed., Disp: 60 capsule, Rfl: 3 .  ondansetron (ZOFRAN ODT) 4 MG disintegrating tablet, Take 1 tablet (4 mg total) by mouth every 6 (six) hours as needed for nausea., Disp: 120 tablet, Rfl: 3 .  promethazine (PHENERGAN) 25 MG tablet, Take 1 tablet (25 mg total) by mouth every 6 (six) hours as needed for nausea or vomiting., Disp: 120  tablet, Rfl: 3 .  ciprofloxacin (CIPRO) 250 MG tablet, Take 250 mg by mouth 2 (two) times daily., Disp: , Rfl:  .  cyclobenzaprine (FLEXERIL) 5 MG tablet, 1 EVERY 12 HOURS.AS NEEDED FOR MUSCLE SPASM AND PAIN TWICE/D, Disp: , Rfl:  .  meloxicam (MOBIC) 15 MG tablet, Take 1 tablet (15 mg total) by mouth daily., Disp: 30 tablet, Rfl: 2   Physical Exam: Physical Exam Vitals and nursing note reviewed.  HENT:     Head: Normocephalic and atraumatic.  Eyes:     Pupils: Pupils are equal, round, and reactive to light.  Neck:     Thyroid: No thyromegaly.  Cardiovascular:  Rate and Rhythm: Normal rate and regular rhythm.  Pulmonary:     Effort: Pulmonary effort is normal.  Abdominal:     General: Bowel sounds are normal. There is no distension.     Palpations: Abdomen is soft.     Tenderness: There is no abdominal tenderness. There is no guarding or rebound.  Musculoskeletal:        General: Normal range of motion.     Cervical back: Normal range of motion and neck supple.  Skin:    General: Skin is warm and dry.  Neurological:     Mental Status: She is alert and oriented to person, place, and time.  Psychiatric:        Mood and Affect: Affect normal.        Judgment: Judgment normal.      Assessment: Faith Gonzales is a 25 y.o. G3P0020 [redacted]w[redacted]d based on Patient's last menstrual period was 07/13/2020. with an Estimated Date of Delivery: 04/19/21,  for prenatal care.  Plan:  1) Avoid alcoholic beverages. 2) Patient encouraged not to smoke.  3) Discontinue the use of all non-medicinal drugs and chemicals.  4) Take prenatal vitamins daily.  5) Seatbelt use advised 6) Nutrition, food safety (fish, cheese advisories, and high nitrite foods) and exercise discussed. 7) Hospital and practice style delivering at [facility] discussed  8) Patient is asked about travel to areas at risk for the Zika virus, and counseled to avoid travel and exposure to mosquitoes or sexual partners who may have themselves  been exposed to the virus. Testing is discussed, and will be ordered as appropriate.  9) Childbirth classes at [facility] advised 10) Genetic Screening, such as with 1st Trimester Screening, cell free fetal DNA, AFP testing, and Ultrasound, as well as with amniocentesis and CVS as appropriate, is discussed with patient. She plans to have genetic testing this pregnancy.  Discussed plan for hyperemesis- rx sent.   Problem list reviewed and updated.  I discussed the assessment and treatment plan with the patient. The patient was provided an opportunity to ask questions and all were answered. The patient agreed with the plan and demonstrated an understanding of the instructions.  Adelene Idler MD [facility], [facility] 08/28/2020 10:28 AM

## 2020-08-28 NOTE — Progress Notes (Signed)
NOB confirmed at home

## 2020-08-29 LAB — CERVICOVAGINAL ANCILLARY ONLY
Chlamydia: NEGATIVE
Comment: NEGATIVE
Comment: NORMAL
Neisseria Gonorrhea: NEGATIVE

## 2020-08-29 LAB — MONITOR DRUG PROFILE 10(MW)
Amphetamine Scrn, Ur: NEGATIVE ng/mL
BARBITURATE SCREEN URINE: NEGATIVE ng/mL
BENZODIAZEPINE SCREEN, URINE: NEGATIVE ng/mL
CANNABINOIDS UR QL SCN: NEGATIVE ng/mL
Cocaine (Metab) Scrn, Ur: NEGATIVE ng/mL
Creatinine(Crt), U: 60.1 mg/dL (ref 20.0–300.0)
Methadone Screen, Urine: NEGATIVE ng/mL
OXYCODONE+OXYMORPHONE UR QL SCN: NEGATIVE ng/mL
Opiate Scrn, Ur: NEGATIVE ng/mL
Ph of Urine: 6.6 (ref 4.5–8.9)
Phencyclidine Qn, Ur: NEGATIVE ng/mL
Propoxyphene Scrn, Ur: NEGATIVE ng/mL

## 2020-08-30 LAB — URINE CULTURE

## 2020-09-06 ENCOUNTER — Other Ambulatory Visit: Payer: Self-pay

## 2020-09-06 ENCOUNTER — Ambulatory Visit (INDEPENDENT_AMBULATORY_CARE_PROVIDER_SITE_OTHER): Payer: BC Managed Care – PPO | Admitting: Obstetrics and Gynecology

## 2020-09-06 ENCOUNTER — Ambulatory Visit (INDEPENDENT_AMBULATORY_CARE_PROVIDER_SITE_OTHER): Payer: BC Managed Care – PPO

## 2020-09-06 DIAGNOSIS — Z3A08 8 weeks gestation of pregnancy: Secondary | ICD-10-CM

## 2020-09-06 DIAGNOSIS — Z130 Encounter for screening for diseases of the blood and blood-forming organs and certain disorders involving the immune mechanism: Secondary | ICD-10-CM

## 2020-09-06 DIAGNOSIS — Z3A01 Less than 8 weeks gestation of pregnancy: Secondary | ICD-10-CM

## 2020-09-06 DIAGNOSIS — Z3480 Encounter for supervision of other normal pregnancy, unspecified trimester: Secondary | ICD-10-CM

## 2020-09-06 DIAGNOSIS — B009 Herpesviral infection, unspecified: Secondary | ICD-10-CM | POA: Insufficient documentation

## 2020-09-06 LAB — OB RESULTS CONSOLE VARICELLA ZOSTER ANTIBODY, IGG: Varicella: IMMUNE

## 2020-09-06 NOTE — Progress Notes (Signed)
    Routine Prenatal Care Visit  Subjective  Faith Gonzales is a 25 y.o. G3P0020 at [redacted]w[redacted]d being seen today for ongoing prenatal care.  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  ----------------------------------------------------------------------------------- Patient reports no complaints.    . Vag. Bleeding: None.   . Denies leaking of fluid.  ----------------------------------------------------------------------------------- The following portions of the patient's history were reviewed and updated as appropriate: allergies, current medications, past family history, past medical history, past social history, past surgical history and problem list. Problem list updated.   Objective  Blood pressure 100/60, weight 147 lb (66.7 kg), last menstrual period 07/09/2020. Pregravid weight 154 lb (69.9 kg) Total Weight Gain -7 lb (-3.175 kg) Urinalysis:      Fetal Status: Fetal Heart Rate (bpm): 169 ([place])         General:  Alert, oriented and cooperative. Patient is in no acute distress.  Skin: Skin is warm and dry. No rash noted.   Cardiovascular: Normal heart rate noted  Respiratory: Normal respiratory effort, no problems with respiration noted  Abdomen: Soft, gravid, appropriate for gestational age.       Pelvic:  Cervical exam deferred        Extremities: Normal range of motion.     ental Status: Normal mood and affect. Normal behavior. Normal judgment and thought content.     Assessment   25 y.o. G3P0020 at [redacted]w[redacted]d by  04/15/2021, by Last Menstrual Period presenting for routine prenatal visit  Plan   Pregnancy #3 Problems (from 08/28/20 to present)    No problems associated with this episode.      -Patient reports significant improvement with N/V sx. Has not been needing to take antiemetics as Rx'd. Patient has been managing  with dietary changes. Weight unchanged since last visit.  -NOB labs today  -Reviewed dating US findings, c/w LMP EDD  -Still considering NIPS  Gestational age appropriate obstetric precautions including but not limited to vaginal bleeding, contractions, leaking of fluid and fetal movement were reviewed in detail with the patient.    Return in about 4 weeks (around 10/04/2020) for ROB.  [name], CNM, MSN [facility], [facility] 09/06/2020, 11:34 AM

## 2020-09-08 LAB — RPR+RH+ABO+RUB AB+AB SCR+CB...
Antibody Screen: NEGATIVE
HIV Screen 4th Generation wRfx: NONREACTIVE
Hematocrit: 40.5 % (ref 34.0–46.6)
Hemoglobin: 13.7 g/dL (ref 11.1–15.9)
Hepatitis B Surface Ag: NEGATIVE
MCH: 31.7 pg (ref 26.6–33.0)
MCHC: 33.8 g/dL (ref 31.5–35.7)
MCV: 94 fL (ref 79–97)
Platelets: 210 10*3/uL (ref 150–450)
RBC: 4.32 x10E6/uL (ref 3.77–5.28)
RDW: 12.4 % (ref 11.7–15.4)
RPR Ser Ql: NONREACTIVE
Rh Factor: POSITIVE
Rubella Antibodies, IGG: 14 index (ref >0.99)
Varicella zoster IgG: 260 index (ref >165)
WBC: 4.9 10*3/uL (ref 3.4–10.8)

## 2020-09-08 LAB — HGB FRACTIONATION CASCADE
Hgb A2: 2.9 % (ref 1.8–3.2)
Hgb A: 97.1 % (ref 96.4–98.8)
Hgb F: 0 % (ref 0.0–2.0)
Hgb S: 0 %

## 2020-09-18 ENCOUNTER — Telehealth: Payer: Self-pay

## 2020-09-18 NOTE — Telephone Encounter (Signed)
Pt calling;  Is concerned about weight loss in preg; has lost 5-6 pounds in a week.  [phone]  VM not set up yet.

## 2020-09-27 NOTE — Telephone Encounter (Signed)
Pt hasn't returned call.  Has appt 10/04/20. msg closed.

## 2020-10-04 ENCOUNTER — Other Ambulatory Visit: Payer: Self-pay

## 2020-10-04 ENCOUNTER — Ambulatory Visit (INDEPENDENT_AMBULATORY_CARE_PROVIDER_SITE_OTHER): Payer: BC Managed Care – PPO | Admitting: Obstetrics

## 2020-10-04 ENCOUNTER — Encounter: Payer: BC Managed Care – PPO | Admitting: Obstetrics & Gynecology

## 2020-10-04 VITALS — BP 120/80 | Wt 146.0 lb

## 2020-10-04 DIAGNOSIS — Z3480 Encounter for supervision of other normal pregnancy, unspecified trimester: Secondary | ICD-10-CM

## 2020-10-04 DIAGNOSIS — Z1379 Encounter for other screening for genetic and chromosomal anomalies: Secondary | ICD-10-CM

## 2020-10-04 DIAGNOSIS — Z3A12 12 weeks gestation of pregnancy: Secondary | ICD-10-CM

## 2020-10-04 NOTE — Progress Notes (Signed)
  Routine Prenatal Care Visit  Subjective  Faith Gonzales is a 25 y.o. G3P0020 at [redacted]w[redacted]d being seen today for ongoing prenatal care.  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  ----------------------------------------------------------------------------------- Patient reports no complaints.  She would like to have the MaternT test. Aware that BCBS does not cover this and she will pay for the testing.  . Vag. Bleeding: None.   . Leaking Fluid denies.  ----------------------------------------------------------------------------------- The following portions of the patient's history were reviewed and updated as appropriate: allergies, current medications, past family history, past medical history, past social history, past surgical history and problem list. Problem list updated.  Objective  Blood pressure 120/80, weight 146 lb (66.2 kg), last menstrual period 07/09/2020. Pregravid weight 154 lb (69.9 kg) Total Weight Gain -8 lb (-3.629 kg) Urinalysis: Urine Protein    Urine Glucose    Fetal Status:           General:  Alert, oriented and cooperative. Patient is in no acute distress.  Skin: Skin is warm and dry. No rash noted.   Cardiovascular: Normal heart rate noted  Respiratory: Normal respiratory effort, no problems with respiration noted  Abdomen: Soft, gravid, appropriate for gestational age. Pain/Pressure: Absent     Pelvic:  Cervical exam deferred        Extremities: Normal range of motion.     Mental Status: Normal mood and affect. Normal behavior. Normal judgment and thought content.   Assessment   25 y.o. G3P0020 at [redacted]w[redacted]d by  04/15/2021, by Last Menstrual Period presenting for routine prenatal visit  Plan   Pregnancy #3 Problems (from 08/28/20 to present)    No problems associated with this episode.       Preterm  labor symptoms and general obstetric precautions including but not limited to vaginal bleeding, contractions, leaking of fluid and fetal movement were reviewed in detail with the patient. Please refer to After Visit Summary for other counseling recommendations.  MaternT drawn today. She is having a gender reveal, so will not look at the My Chart report  Return in about 4 weeks (around 11/01/2020) for return OB.  Mirna Mires, CNM  10/04/2020 12:06 PM

## 2020-10-08 LAB — MATERNIT 21 PLUS CORE, BLOOD
Fetal Fraction: 7
Result (T21): NEGATIVE
Trisomy 13 (Patau syndrome): NEGATIVE
Trisomy 18 (Edwards syndrome): NEGATIVE
Trisomy 21 (Down syndrome): NEGATIVE

## 2020-10-30 NOTE — Progress Notes (Signed)
    Routine Prenatal Care Visit  Delayed chart closure.  Subjective  Faith Gonzales is a 25 y.o. G3P0020 at [redacted]w[redacted]d being seen today for ongoing prenatal care.  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  ----------------------------------------------------------------------------------- Patient reports no complaints.   Reports normal fetal movement. Denies contractions. Denies vaginal bleeding.  Denies leaking of fluid.  ----------------------------------------------------------------------------------- The following portions of the patient's history were reviewed and updated as appropriate: allergies, current medications, past family history, past medical history, past social history, past surgical history and problem list. Problem list updated.   Objective  Weight 170 lb (77.1 kg), last menstrual period 06/19/2018. Pregravid weight 160 lb (72.6 kg) Total Weight Gain 10 lb (4.536 kg) Urinalysis:      Fetal Status: heart tones present  General:  Alert, oriented and cooperative. Patient is in no acute distress.  Skin: Skin is warm and dry. No rash noted.   Cardiovascular: Normal heart rate noted  Respiratory: Normal respiratory effort, no problems with respiration noted  Abdomen: Soft, gravid, appropriate for gestational age.       Pelvic:  Cervical exam deferred        Extremities: Normal range of motion.     Mental Status: Normal mood and affect. Normal behavior. Normal judgment and thought content.     Assessment   25 y.o. G3P0020 at 155w3d by  03/26/2019, by Last Menstrual Period presenting for routine prenatal visit  Plan   pregnancy#2 Problems (from 06/19/18 to 06/18/19)    Problem Noted Resolved   Supervision of other normal pregnancy, antepartum 08/06/2018 by Conard Novak, MD No   Overview Addendum  10/09/2020 11:34 AM by Mirna Mires, CNM    Clinic [facility] Prenatal Labs  Dating  LMP = 8wkk [place] Blood type:   A +  Genetic Screen  NIPS: MaternT negative xy Antibody: negative  Anatomic [place]  Rubella:  non immune Varicella:nonimmune @VZVIGG @  GTT   Third trimester:  RPR:   NR  Rhogam  n/a HBsAg:   negative  TDaP vaccine                       Flu Shot: HIV:   NR  Baby Food                                GBS:   Contraception  Pap: 08/06/18 NILM  CBB     CS/VBAC    Support Person            Previous Version       Gestational age appropriate obstetric precautions including but not limited to vaginal bleeding, contractions, leaking of fluid and fetal movement were reviewed in detail with the patient.    No follow-ups on file.  14/12/19 MD [facility], [facility] 10/30/2020, 9:11 AM

## 2020-11-02 ENCOUNTER — Other Ambulatory Visit: Payer: Self-pay

## 2020-11-02 ENCOUNTER — Ambulatory Visit: Payer: Self-pay

## 2020-11-02 ENCOUNTER — Ambulatory Visit (INDEPENDENT_AMBULATORY_CARE_PROVIDER_SITE_OTHER): Payer: BC Managed Care – PPO | Admitting: Obstetrics and Gynecology

## 2020-11-02 VITALS — BP 114/70 | Ht 63.0 in | Wt 158.2 lb

## 2020-11-02 DIAGNOSIS — Z3689 Encounter for other specified antenatal screening: Secondary | ICD-10-CM

## 2020-11-02 DIAGNOSIS — Z3480 Encounter for supervision of other normal pregnancy, unspecified trimester: Secondary | ICD-10-CM

## 2020-11-02 DIAGNOSIS — Z3A16 16 weeks gestation of pregnancy: Secondary | ICD-10-CM

## 2020-11-02 NOTE — Progress Notes (Signed)
Routine Prenatal Care Visit  Subjective  Faith Gonzales is a 25 y.o. G3P0020 at [redacted]w[redacted]d being seen today for ongoing prenatal care.  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  ----------------------------------------------------------------------------------- Patient reports no complaints.   Contractions: Not present. Vag. Bleeding: None.  Movement: Absent. Denies leaking of fluid.  ----------------------------------------------------------------------------------- The following portions of the patient's history were reviewed and updated as appropriate: allergies, current medications, past family history, past medical history, past social history, past surgical history and problem list. Problem list updated.   Objective  Blood pressure 114/70, height 5\' 3"  (1.6 m), weight 158 lb 3.2 oz (71.8 kg), last menstrual period 07/09/2020. Pregravid weight 154 lb (69.9 kg) Total Weight Gain 4 lb 3.2 oz (1.905 kg) Urinalysis:      Fetal Status: Fetal Heart Rate (bpm): 140   Movement: Absent     General:  Alert, oriented and cooperative. Patient is in no acute distress.  Skin: Skin is warm and dry. No rash noted.   Cardiovascular: Normal heart rate noted  Respiratory: Normal respiratory effort, no problems with respiration noted  Abdomen: Soft, gravid, appropriate for gestational age. Pain/Pressure: Absent     Pelvic:  Cervical exam deferred        Extremities: Normal range of motion.  Edema: None  ental Status: Normal mood and affect. Normal behavior. Normal judgment and thought content.     Assessment   25 y.o. 25 at [redacted]w[redacted]d by  04/15/2021, by Last Menstrual Period presenting for routine prenatal visit  Plan   Pregnancy #3 Problems (from 08/28/20 to present)    Problem Noted Resolved   Supervision of other normal pregnancy,  antepartum 08/06/2018 by 14/07/2018, MD No   Overview Addendum 11/02/2020 10:36 AM by 01/02/2021, CNM     Nursing Staff Provider  Office Location  [facility] Dating   LMP = 8wk Faith Gonzales  Language  English Anatomy US    Flu Vaccine   Genetic Screen  NIPS: negative x3, XY   TDaP vaccine    Hgb A1C or  GTT Third trimester :   Rhogam   n/a   LAB RESULTS   Feeding Plan  breast Blood Type A/Positive/-- (01/12 1133)   Contraception  unsure Antibody Negative (01/12 1133)  Circumcision  Rubella 14.00 (01/12 1133)  Pediatrician    RPR Non Reactive (01/12 1133)   Support Person  boyfriend "Faith Gonzales" HBsAg Negative (01/12 1133)   Prenatal Classes  information provided 11/02/20 HIV Non Reactive (01/12 1133)    Varicella  non-immune  BTL Consent  n/a GBS  (For PCN allergy, check sensitivities)        VBAC Consent  n/a Pap  08/06/18 NILM    Hgb Electro   normal adult hgb    CF      SMA               Previous Version      -Reviewed plan for anatomy scan at next visit -Answered requests regarding caffeine consumption in pregnancy  Second trimester precautions including but not limited to vaginal bleeding, contractions, leaking of fluid and fetal movement were reviewed in detail with the patient.    Return in about 3 weeks (around 11/23/2020) for ROB and anatomy scan.  11/25/2020, CNM, MSN [facility], [facility] 11/02/2020, 10:38 AM

## 2020-11-03 ENCOUNTER — Other Ambulatory Visit: Payer: Self-pay

## 2020-11-06 ENCOUNTER — Other Ambulatory Visit: Payer: Self-pay

## 2020-11-06 ENCOUNTER — Ambulatory Visit (LOCAL_COMMUNITY_HEALTH_CENTER): Payer: Self-pay

## 2020-11-06 DIAGNOSIS — Z111 Encounter for screening for respiratory tuberculosis: Secondary | ICD-10-CM

## 2020-11-09 ENCOUNTER — Other Ambulatory Visit: Payer: Medicaid Other

## 2020-11-20 ENCOUNTER — Ambulatory Visit (LOCAL_COMMUNITY_HEALTH_CENTER): Payer: Medicaid Other

## 2020-11-20 ENCOUNTER — Other Ambulatory Visit: Payer: Self-pay

## 2020-11-20 DIAGNOSIS — Z111 Encounter for screening for respiratory tuberculosis: Secondary | ICD-10-CM

## 2020-11-23 ENCOUNTER — Other Ambulatory Visit: Payer: Self-pay

## 2020-11-23 ENCOUNTER — Ambulatory Visit (LOCAL_COMMUNITY_HEALTH_CENTER): Payer: Medicaid Other

## 2020-11-23 DIAGNOSIS — Z111 Encounter for screening for respiratory tuberculosis: Secondary | ICD-10-CM

## 2020-11-23 LAB — TB SKIN TEST
Induration: 0 mm
TB Skin Test: NEGATIVE

## 2020-12-04 ENCOUNTER — Other Ambulatory Visit: Payer: Self-pay

## 2020-12-04 ENCOUNTER — Ambulatory Visit
Admission: RE | Admit: 2020-12-04 | Discharge: 2020-12-04 | Disposition: A | Discharge status: Home / Self Care | Payer: BC Managed Care – PPO | Source: Ambulatory Visit | Attending: Obstetrics and Gynecology | Admitting: Obstetrics and Gynecology

## 2020-12-04 DIAGNOSIS — Z3689 Encounter for other specified antenatal screening: Secondary | ICD-10-CM | POA: Diagnosis present

## 2020-12-05 ENCOUNTER — Ambulatory Visit (INDEPENDENT_AMBULATORY_CARE_PROVIDER_SITE_OTHER): Payer: Medicaid Other | Admitting: Obstetrics and Gynecology

## 2020-12-05 VITALS — BP 120/70 | Ht 63.0 in | Wt 168.2 lb

## 2020-12-05 DIAGNOSIS — Z3A21 21 weeks gestation of pregnancy: Secondary | ICD-10-CM

## 2020-12-05 DIAGNOSIS — O358XX0 Maternal care for other (suspected) fetal abnormality and damage, not applicable or unspecified: Secondary | ICD-10-CM

## 2020-12-05 DIAGNOSIS — Z3480 Encounter for supervision of other normal pregnancy, unspecified trimester: Secondary | ICD-10-CM

## 2020-12-05 DIAGNOSIS — O35EXX0 Maternal care for other (suspected) fetal abnormality and damage, fetal genitourinary anomalies, not applicable or unspecified: Secondary | ICD-10-CM

## 2020-12-05 NOTE — Progress Notes (Signed)
Routine Prenatal Care Visit  Subjective  Faith Gonzales is a 25 y.o. G3P0020 at [redacted]w[redacted]d being seen today for ongoing prenatal care.  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  ----------------------------------------------------------------------------------- Patient reports no complaints.  Had a positive experience with anatomy scan yesterday. Contractions: Not present. Vag. Bleeding: None.  Movement: Present. Denies leaking of fluid.  ----------------------------------------------------------------------------------- The following portions of the patient's history were reviewed and updated as appropriate: allergies, current medications, past family history, past medical history, past social history, past surgical history and problem list. Problem list updated.  Objective  Blood pressure 120/70, height 5\' 3"  (1.6 m), weight 168 lb 3.2 oz (76.3 kg), last menstrual period 07/09/2020. Pregravid weight 154 lb (69.9 kg) Total Weight Gain 14 lb 3.2 oz (6.441 kg) Urinalysis:      Fetal Status: Fetal Heart Rate (bpm): 155   Movement: Present     General:  Alert, oriented and cooperative. Patient is in no acute distress.  Skin: Skin is warm and dry. No rash noted.   Cardiovascular: Normal heart rate noted  Respiratory: Normal respiratory effort, no problems with respiration noted  Abdomen: Soft, gravid, appropriate for gestational age. Pain/Pressure: Absent     Pelvic:  Cervical exam deferred        Extremities: Normal range of motion.  Edema: None  ental Status: Normal mood and affect. Normal behavior. Normal judgment and thought content.   Assessment   25 y.o. G3P0020 at [redacted]w[redacted]d by  04/15/2021, by Last Menstrual Period presenting for routine prenatal visit  Plan   Pregnancy #3 Problems (from 08/28/20 to present)    Problem Noted  Resolved   Supervision of other normal pregnancy, antepartum 08/06/2018 by 14/07/2018, MD No   Overview Addendum 11/02/2020 10:36 AM by 01/02/2021, CNM     Nursing Staff Provider  Office Location  [facility] Dating   LMP = 8wk [name]  Language  English Anatomy US   Follow-up at 28 weeks for fetal pyelectasis, orders placed  Flu Vaccine   Genetic Screen  NIPS: negative x3, XY   TDaP vaccine    Hgb A1C or  GTT Third trimester :   Rhogam   n/a   LAB RESULTS   Feeding Plan  breast Blood Type A/Positive/-- (01/12 1133)   Contraception  unsure Antibody Negative (01/12 1133)  Circumcision  Rubella 14.00 (01/12 1133)  Pediatrician    RPR Non Reactive (01/12 1133)   Support Person  boyfriend "[place]" HBsAg Negative (01/12 1133)   Prenatal Classes  information provided 11/02/20 HIV Non Reactive (01/12 1133)    Varicella  non-immune  BTL Consent  n/a GBS  (For PCN allergy, check sensitivities)        VBAC Consent  n/a Pap  08/06/18 NILM    Hgb Electro   normal adult hgb    CF      SMA               Previous Version      -Awaiting anatomy scan report to result - discussed with patient, will call if any additional follow-up is needed based on results  -Anatomy scan resulted following visit - spoke with patient via phone to review bilateral fetal pyelectasis, plan for follow-up 14/12/19 at 28 weeks. Patient stated understanding.  Reviewed second trimester precautions including but not limited to vaginal  bleeding, contractions, leaking of fluid and fetal movement were reviewed in detail with the patient.    Return in about 4 weeks (around 01/02/2021) for ROB.  [name], CNM, MSN [facility], [facility] 12/05/2020, 11:27 AM

## 2020-12-22 ENCOUNTER — Telehealth: Payer: Self-pay

## 2020-12-22 NOTE — Telephone Encounter (Signed)
Pt called office and was transferred to me by Archie Patten. She said she went to bathroom and wiped and had very little blood. She then went again and wiped, nothing was there anymore. Mentioned she had intercoure 2 days ago, denies cramping. Advised to go to ER if she started bleeding more and it became like a period flow or severe cramping/pelvic pain.

## 2021-01-02 ENCOUNTER — Ambulatory Visit (INDEPENDENT_AMBULATORY_CARE_PROVIDER_SITE_OTHER): Payer: BC Managed Care – PPO | Admitting: Advanced Practice Midwife

## 2021-01-02 ENCOUNTER — Other Ambulatory Visit: Payer: Self-pay

## 2021-01-02 ENCOUNTER — Encounter: Payer: Self-pay | Admitting: Advanced Practice Midwife

## 2021-01-02 VITALS — BP 112/72 | Wt 169.0 lb

## 2021-01-02 DIAGNOSIS — Z113 Encounter for screening for infections with a predominantly sexual mode of transmission: Secondary | ICD-10-CM

## 2021-01-02 DIAGNOSIS — Z3A25 25 weeks gestation of pregnancy: Secondary | ICD-10-CM

## 2021-01-02 DIAGNOSIS — Z369 Encounter for antenatal screening, unspecified: Secondary | ICD-10-CM

## 2021-01-02 DIAGNOSIS — Z131 Encounter for screening for diabetes mellitus: Secondary | ICD-10-CM

## 2021-01-02 DIAGNOSIS — Z130 Encounter for screening for diseases of the blood and blood-forming organs and certain disorders involving the immune mechanism: Secondary | ICD-10-CM

## 2021-01-02 DIAGNOSIS — Z3482 Encounter for supervision of other normal pregnancy, second trimester: Secondary | ICD-10-CM

## 2021-01-02 LAB — POCT URINALYSIS DIPSTICK OB
Glucose, UA: NEGATIVE
POC,PROTEIN,UA: NEGATIVE

## 2021-01-02 NOTE — Patient Instructions (Signed)
Heartburn During Pregnancy  Heartburn is a type of pain or discomfort in the throat or chest. It may cause a burning feeling. It happens when stomach acid backs up into the part of the body that moves food from your mouth to your stomach (esophagus). This condition is also called acid reflux. Heartburn is common during pregnancy. It usually goes away or gets better after giving birth. What are the causes? This condition is caused by stomach acid that backs up into the part of the body that moves food from your mouth to your stomach. Acid can back up because of: Changing amounts of hormones in the body. Large meals. Certain foods and drinks. Exercise. More acid being made in the stomach. What increases the risk? You are more likely to develop this condition if: You had heartburn before you became pregnant. You have been pregnant more than once before. You are overweight or obese. Heartburn is also likely to happen as you get further along in your pregnancy. The risk is higher in the last 3 months before birth (third trimester). What are the signs or symptoms? Symptoms of this condition include: Burning pain in the chest or lower throat. A bitter taste in the mouth. Coughing. Problems swallowing. Vomiting. A hoarse voice. Asthma. Symptoms may get worse when you lie down or bend over. You may feel worse at night. How is this treated? Treatment for this condition depends on how bad your symptoms are. Your doctor may ask you to: Take over-the-counter medicines for mild heartburn. These medicines include antacids or acid reducers. Take prescription medicines to reduce stomach acid or to protect your stomach. Change your diet. Raise the head of your bed so it is higher than the foot of the bed. Follow these instructions at home: Eating and drinking Do not drink alcohol while you are pregnant. Learn which foods and drinks make you feel worse, and avoid them. Eat small meals often, instead  of large meals. Avoid drinking a lot of liquid with your meals. Avoid eating meals during the 2-3 hours before you go to bed. Avoid lying down right after you eat. Do not exercise right after you eat. Drinks to avoid Coffee and tea (with or without caffeine). Energy drinks and sports drinks. Carbonated drinks or sodas. Citrus fruit juices. Foods to avoid Chocolate and cocoa. Peppermint and mint flavorings. Garlic, onions, and horseradish. Spicy foods and foods that have a lot of acid in them. These include peppers, chili powder, curry powder, vinegar, hot sauces, and barbecue sauce. Citrus fruits, such as oranges, lemons, and limes. Tomato-based foods, such as red sauce, chili, and salsa. Fried and fatty foods, such as donuts, french fries, potato chips, and high-fat dressings. High-fat meats, such as hot dogs, precooked or cured meats, sausage, ham, and bacon. High-fat dairy items, such as whole milk, butter, and cheese. Medicines Take over-the-counter and prescription medicines only as told by your doctor. Do not take aspirin or NSAIDs, such as ibuprofen, unless your doctor tells you to do that. Your doctor may tell you to avoid medicines that have sodium bicarbonate in them. General instructions If told, raise the head of your bed about 6 inches (15 cm). You can do this by putting blocks under the legs. Sleeping with more pillows does not help with heartburn. Do not use any products that contain nicotine or tobacco, such as cigarettes, e-cigarettes, and chewing tobacco. If you need help quitting, ask your doctor. Wear loose-fitting clothing. Try to lower your stress, such as with   more pillows does not help with heartburn.  Do not use any products that contain nicotine or tobacco, such as cigarettes, e-cigarettes, and chewing tobacco. If you need help quitting, ask your doctor.  Wear loose-fitting clothing.  Try to lower your stress, such as with yoga or meditation. If you need help, ask your doctor.  Stay at a healthy weight. If you are overweight, work with your doctor to safely manage your weight.  Keep all follow-up visits as told by your doctor. This  is important. Where to find more information  American Pregnancy Association: americanpregnancy.org Contact a doctor if:  You get new symptoms.  Your symptoms do not get better with treatment.  You lose weight and you do not know why.  You have trouble swallowing.  You make loud sounds when you breathe (wheeze).  You have a cough that does not go away.  You have heartburn often for more than 2 weeks.  You feel like you may vomit (nausea), or you vomit, and this does not get better with treatment.  You have pain in your belly (abdomen). Get help right away if:  You have very bad chest pain that spreads to your arm, neck, or jaw.  You feel sweaty, dizzy, or light-headed.  You have trouble breathing.  You have pain when swallowing.  You vomit, and your vomit looks like blood or coffee grounds.  Your poop (stool) is bloody or black. Summary  Heartburn in pregnancy is common, especially during the last 3 months before birth.  This condition is caused by stomach acid backing up into the part of the body that moves food from the mouth to the stomach.  This condition can be treated with medicines, changes to your diet, or raising the head of your bed.  Contact a doctor if your symptoms do not go away or you get new symptoms. This information is not intended to replace advice given to you by your health care provider. Make sure you discuss any questions you have with your health care provider. Document Revised: 05/05/2019 Document Reviewed: 05/05/2019 Elsevier Patient Education  2021 [employer].

## 2021-01-02 NOTE — Progress Notes (Signed)
  Routine Prenatal Care Visit  Subjective  Faith Gonzales is a 25 y.o. G3P0020 at [redacted]w[redacted]d being seen today for ongoing prenatal care.  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  ----------------------------------------------------------------------------------- Patient reports heartburn. Comfort measures discussed.   Contractions: Not present. Vag. Bleeding: None.  Movement: Present. Leaking Fluid denies.  ----------------------------------------------------------------------------------- The following portions of the patient's history were reviewed and updated as appropriate: allergies, current medications, past family history, past medical history, past social history, past surgical history and problem list. Problem list updated.  Objective  Blood pressure 112/72, weight 169 lb (76.7 kg), last menstrual period 07/09/2020. Pregravid weight 154 lb (69.9 kg) Total Weight Gain 15 lb (6.804 kg) Urinalysis: Urine Protein Negative  Urine Glucose Negative  Fetal Status: Fetal Heart Rate (bpm): 151 Fundal Height: 25 cm Movement: Present     General:  Alert, oriented and cooperative. Patient is in no acute distress.  Skin: Skin is warm and dry. No rash noted.   Cardiovascular: Normal heart rate noted  Respiratory: Normal respiratory effort, no problems with respiration noted  Abdomen: Soft, gravid, appropriate for gestational age. Pain/Pressure: Absent     Pelvic:  Cervical exam deferred        Extremities: Normal range of motion.  Edema: None  Mental Status: Normal mood and affect. Normal behavior. Normal judgment and thought content.   Assessment   25 y.o. G3P0020 at [redacted]w[redacted]d by  04/15/2021, by Last Menstrual Period presenting for routine prenatal visit  Plan   Pregnancy #3 Problems (from 08/28/20 to present)    Problem Noted Resolved    Supervision of other normal pregnancy, antepartum 08/06/2018 by Conard Novak, MD No   Overview Addendum 12/05/2020 11:40 AM by [name], CNM     Nursing Staff Provider  Office Location  [facility] Dating   LMP = 8wk [place]  Language  English Anatomy US  Follow-up at 28 weeks for fetal pyelectasis, orders placed  Flu Vaccine   Genetic Screen  NIPS: negative x3, XY   TDaP vaccine    Hgb A1C or  GTT Third trimester :   Rhogam   n/a   LAB RESULTS   Feeding Plan  breast Blood Type A/Positive/-- (01/12 1133)   Contraception  unsure Antibody Negative (01/12 1133)  Circumcision  Rubella 14.00 (01/12 1133)  Pediatrician    RPR Non Reactive (01/12 1133)   Support Person  boyfriend "Tyreek" HBsAg Negative (01/12 1133)   Prenatal Classes  information provided 11/02/20 HIV Non Reactive (01/12 1133)    Varicella  non-immune  BTL Consent  n/a GBS  (For PCN allergy, check sensitivities)        VBAC Consent  n/a Pap  08/06/18 NILM    Hgb Electro   normal adult hgb    CF      SMA               Previous Version       Preterm labor symptoms and general obstetric precautions including but not limited to vaginal bleeding, contractions, leaking of fluid and fetal movement were reviewed in detail with the patient. Please refer to After Visit Summary for other counseling recommendations.   Return in about 3 weeks (around 01/23/2021) for add 28 wk labs to next scheduled appt.  Tresea Mall, CNM 01/02/2021 2:17 PM

## 2021-01-30 ENCOUNTER — Ambulatory Visit
Admission: RE | Admit: 2021-01-30 | Discharge: 2021-01-30 | Disposition: A | Discharge status: Home / Self Care | Payer: BC Managed Care – PPO | Source: Ambulatory Visit | Attending: Obstetrics and Gynecology | Admitting: Obstetrics and Gynecology

## 2021-01-30 ENCOUNTER — Other Ambulatory Visit: Payer: Self-pay

## 2021-01-30 DIAGNOSIS — O358XX0 Maternal care for other (suspected) fetal abnormality and damage, not applicable or unspecified: Secondary | ICD-10-CM | POA: Insufficient documentation

## 2021-01-30 DIAGNOSIS — O35EXX0 Maternal care for other (suspected) fetal abnormality and damage, fetal genitourinary anomalies, not applicable or unspecified: Secondary | ICD-10-CM

## 2021-01-31 ENCOUNTER — Ambulatory Visit (INDEPENDENT_AMBULATORY_CARE_PROVIDER_SITE_OTHER): Payer: BC Managed Care – PPO | Admitting: Obstetrics & Gynecology

## 2021-01-31 ENCOUNTER — Other Ambulatory Visit: Payer: BC Managed Care – PPO

## 2021-01-31 ENCOUNTER — Encounter: Payer: Self-pay | Admitting: Obstetrics & Gynecology

## 2021-01-31 ENCOUNTER — Encounter: Payer: BC Managed Care – PPO | Admitting: Obstetrics and Gynecology

## 2021-01-31 ENCOUNTER — Other Ambulatory Visit: Payer: Self-pay | Admitting: Advanced Practice Midwife

## 2021-01-31 VITALS — BP 120/80 | Wt 173.0 lb

## 2021-01-31 DIAGNOSIS — Z130 Encounter for screening for diseases of the blood and blood-forming organs and certain disorders involving the immune mechanism: Secondary | ICD-10-CM

## 2021-01-31 DIAGNOSIS — Z131 Encounter for screening for diabetes mellitus: Secondary | ICD-10-CM

## 2021-01-31 DIAGNOSIS — B009 Herpesviral infection, unspecified: Secondary | ICD-10-CM

## 2021-01-31 DIAGNOSIS — Z3482 Encounter for supervision of other normal pregnancy, second trimester: Secondary | ICD-10-CM

## 2021-01-31 DIAGNOSIS — Z369 Encounter for antenatal screening, unspecified: Secondary | ICD-10-CM

## 2021-01-31 DIAGNOSIS — Z113 Encounter for screening for infections with a predominantly sexual mode of transmission: Secondary | ICD-10-CM

## 2021-01-31 DIAGNOSIS — Z3480 Encounter for supervision of other normal pregnancy, unspecified trimester: Secondary | ICD-10-CM

## 2021-01-31 DIAGNOSIS — Z3483 Encounter for supervision of other normal pregnancy, third trimester: Secondary | ICD-10-CM

## 2021-01-31 DIAGNOSIS — Z3A29 29 weeks gestation of pregnancy: Secondary | ICD-10-CM

## 2021-01-31 NOTE — Progress Notes (Signed)
  Subjective  Fetal Movement? yes Contractions? no Leaking Fluid? no Vaginal Bleeding? no  Objective  BP 120/80   Wt 173 lb (78.5 kg)   LMP 07/09/2020   BMI 30.65 kg/m  General: NAD Pumonary: no increased work of breathing Abdomen: gravid, non-tender Extremities: no edema Psychiatric: mood appropriate, affect full  Assessment  24 y.o. G3P0020 at [redacted]w[redacted]d by  04/15/2021, by Last Menstrual Period presenting for routine prenatal visit  Plan   Problem List Items Addressed This Visit      Other   Supervision of other normal pregnancy, antepartum   HSV-1 (herpes simplex virus 1) infection    Other Visit Diagnoses    Encounter for supervision of other normal pregnancy in third trimester    -  Primary   [redacted] weeks gestation of pregnancy        PNV, [facility] Glucola today Valtrex 36 weeks  Pregnancy #3 Problems (from 08/28/20 to present)    Problem Noted Resolved   HSV-1 (herpes simplex virus 1) infection     Overview Signed 01/31/2021  9:18 AM by Nadara Mustard, MD    Prophylaxis at 36 weeks w Valtrex      Supervision of other normal pregnancy, antepartum     Overview Addendum 01/31/2021  9:18 AM by Nadara Mustard, MD     Nursing Staff Provider  Office Location  [facility] Dating   LMP = 8wk [place]  Language  English Anatomy US  Follow-up at 28 weeks for fetal pyelectasis, orders placed  Flu Vaccine  no Genetic Screen  NIPS: negative x3, XY   TDaP vaccine   nv Hgb A1C or  GTT Third trimester : today  Rhogam   n/a   LAB RESULTS   Feeding Plan  breast Blood Type A/Positive/-- (01/12 1133)   Contraception  unsure Antibody Negative (01/12 1133)  Circumcision  Rubella 14.00 (01/12 1133)  Pediatrician    RPR Non Reactive (01/12 1133)   Support Person  boyfriend "Tyreek" HBsAg Negative (01/12 1133)   Prenatal Classes  information provided 11/02/20 HIV Non Reactive (01/12 1133)    Varicella  non-immune  BTL Consent  n/a GBS  (For PCN allergy, check sensitivities)        VBAC Consent   n/a Pap  08/06/18 NILM    Hgb Electro   normal adult hgb    CF      SMA               Annamarie Major, MD, [facility], [facility] 01/31/2021  9:19 AM

## 2021-01-31 NOTE — Patient Instructions (Signed)

## 2021-02-01 LAB — 28 WEEK RH+PANEL
Basophils Absolute: 0 10*3/uL (ref 0.0–0.2)
Basos: 0 %
EOS (ABSOLUTE): 0.2 10*3/uL (ref 0.0–0.4)
Eos: 3 %
Gestational Diabetes Screen: 84 mg/dL (ref 65–139)
HIV Screen 4th Generation wRfx: NONREACTIVE
Hematocrit: 42.7 % (ref 34.0–46.6)
Hemoglobin: 14.7 g/dL (ref 11.1–15.9)
Immature Grans (Abs): 0 10*3/uL (ref 0.0–0.1)
Immature Granulocytes: 0 %
Lymphocytes Absolute: 1.3 10*3/uL (ref 0.7–3.1)
Lymphs: 20 %
MCH: 33.7 pg — ABNORMAL HIGH (ref 26.6–33.0)
MCHC: 34.4 g/dL (ref 31.5–35.7)
MCV: 98 fL — ABNORMAL HIGH (ref 79–97)
Monocytes Absolute: 0.4 10*3/uL (ref 0.1–0.9)
Monocytes: 5 %
Neutrophils Absolute: 4.8 10*3/uL (ref 1.4–7.0)
Neutrophils: 72 %
Platelets: 170 10*3/uL (ref 150–450)
RBC: 4.36 x10E6/uL (ref 3.77–5.28)
RDW: 12.7 % (ref 11.7–15.4)
RPR Ser Ql: NONREACTIVE
WBC: 6.8 10*3/uL (ref 3.4–10.8)

## 2021-02-14 ENCOUNTER — Encounter: Payer: Self-pay | Admitting: Obstetrics and Gynecology

## 2021-02-14 ENCOUNTER — Ambulatory Visit (INDEPENDENT_AMBULATORY_CARE_PROVIDER_SITE_OTHER): Payer: BC Managed Care – PPO | Admitting: Obstetrics and Gynecology

## 2021-02-14 ENCOUNTER — Other Ambulatory Visit: Payer: Self-pay

## 2021-02-14 VITALS — BP 108/70 | Ht 63.0 in | Wt 176.0 lb

## 2021-02-14 DIAGNOSIS — Z23 Encounter for immunization: Secondary | ICD-10-CM

## 2021-02-14 DIAGNOSIS — Z3480 Encounter for supervision of other normal pregnancy, unspecified trimester: Secondary | ICD-10-CM

## 2021-02-14 DIAGNOSIS — Z3A31 31 weeks gestation of pregnancy: Secondary | ICD-10-CM

## 2021-02-14 NOTE — Progress Notes (Signed)
Routine Prenatal Care Visit  Subjective  Faith Gonzales is a 25 y.o. G3P0020 at [redacted]w[redacted]d being seen today for ongoing prenatal care.  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  ----------------------------------------------------------------------------------- Patient reports no complaints.   Contractions: Not present. Vag. Bleeding: None.  Movement: Present. Denies leaking of fluid.  ----------------------------------------------------------------------------------- The following portions of the patient's history were reviewed and updated as appropriate: allergies, current medications, past family history, past medical history, past social history, past surgical history and problem list. Problem list updated.   Objective  Blood pressure 108/70, height 5\' 3"  (1.6 m), weight 176 lb (79.8 kg), last menstrual period 07/09/2020. Pregravid weight 154 lb (69.9 kg) Total Weight Gain 22 lb (9.979 kg) Urinalysis:      Fetal Status: Fetal Heart Rate (bpm): 145 Fundal Height: 32 cm Movement: Present     General:  Alert, oriented and cooperative. Patient is in no acute distress.  Skin: Skin is warm and dry. No rash noted.   Cardiovascular: Normal heart rate noted  Respiratory: Normal respiratory effort, no problems with respiration noted  Abdomen: Soft, gravid, appropriate for gestational age. Pain/Pressure: Absent     Pelvic:  Cervical exam deferred        Extremities: Normal range of motion.  Edema: None  Mental Status: Normal mood and affect. Normal behavior. Normal judgment and thought content.     Assessment   25 y.o. G3P0020 at [redacted]w[redacted]d by  04/15/2021, by Last Menstrual Period presenting for routine prenatal visit  Plan   Pregnancy #3 Problems (from 08/28/20 to present)     Problem Noted Resolved   HSV-1 (herpes simplex virus  1) infection 09/06/2020 by 11/04/2020, CNM No   Overview Signed 01/31/2021  9:18 AM by 04/02/2021, MD    Prophylaxis at 36 weeks w Valtrex       Supervision of other normal pregnancy, antepartum 08/06/2018 by 14/07/2018, MD No   Overview Addendum 02/14/2021  4:24 PM by 02/16/2021, MD     Nursing Staff Provider  Office Location  [facility] Dating   LMP = 8wk Natale Milch  Language  English Anatomy US   complete - needs third trimester [place] for renal pyelectasis  Flu Vaccine  no Genetic Screen  NIPS: negative x3, XY   TDaP vaccine   02/14/2021 Hgb A1C or  GTT Third trimester : nml  Rhogam   n/a   LAB RESULTS   Feeding Plan  breast Blood Type A/Positive/-- (01/12 1133)   Contraception  unsure Antibody Negative (01/12 1133)  Circumcision  Rubella 14.00 (01/12 1133)  Pediatrician    RPR Non Reactive (01/12 1133)   Support Person  boyfriend "Tyreek" HBsAg Negative (01/12 1133)   Prenatal Classes  information provided 11/02/20 HIV Non Reactive (01/12 1133)    Varicella  non-immune  BTL Consent  n/a GBS  (For PCN allergy, check sensitivities)        VBAC Consent  n/a Pap  08/06/18 NILM    Hgb Electro   normal adult hgb    CF      SMA                    Discussed repeat 3rd trimester 14/12/19 versus infant renal US after delivery which would be likely in the setting of persistent pyelectasis. Patient comfortable with  repeat US after delivery.   Gestational age appropriate obstetric precautions including but not limited to vaginal bleeding, contractions, leaking of fluid and fetal movement were reviewed in detail with the patient.    Return in about 2 weeks (around 02/28/2021) for ROB in person.  Natale Milch MD [facility], [facility] 02/14/2021, 4:26 PM

## 2021-02-14 NOTE — Addendum Note (Signed)
Addended by: Clement Husbands A on: 02/14/2021 04:36 PM   Modules accepted: Orders

## 2021-02-28 ENCOUNTER — Ambulatory Visit (INDEPENDENT_AMBULATORY_CARE_PROVIDER_SITE_OTHER): Payer: BC Managed Care – PPO | Admitting: Obstetrics and Gynecology

## 2021-02-28 ENCOUNTER — Encounter: Payer: Self-pay | Admitting: Obstetrics and Gynecology

## 2021-02-28 ENCOUNTER — Other Ambulatory Visit: Payer: Self-pay

## 2021-02-28 VITALS — BP 104/64 | Wt 176.0 lb

## 2021-02-28 DIAGNOSIS — Z3A33 33 weeks gestation of pregnancy: Secondary | ICD-10-CM

## 2021-02-28 DIAGNOSIS — Z3483 Encounter for supervision of other normal pregnancy, third trimester: Secondary | ICD-10-CM

## 2021-02-28 DIAGNOSIS — B009 Herpesviral infection, unspecified: Secondary | ICD-10-CM

## 2021-02-28 LAB — POCT URINALYSIS DIPSTICK OB
Glucose, UA: NEGATIVE
POC,PROTEIN,UA: NEGATIVE

## 2021-02-28 NOTE — Progress Notes (Signed)
Routine Prenatal Care Visit  Subjective  Faith Gonzales is a 25 y.o. G3P0020 at [redacted]w[redacted]d being seen today for ongoing prenatal care.  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  ----------------------------------------------------------------------------------- Patient reports no complaints.   Contractions: Not present. Vag. Bleeding: None.  Movement: Present. Leaking Fluid denies.  ----------------------------------------------------------------------------------- The following portions of the patient's history were reviewed and updated as appropriate: allergies, current medications, past family history, past medical history, past social history, past surgical history and problem list. Problem list updated.  Objective  Blood pressure 104/64, weight 176 lb (79.8 kg), last menstrual period 07/09/2020. Pregravid weight 154 lb (69.9 kg) Total Weight Gain 22 lb (9.979 kg) Urinalysis: Urine Protein Negative  Urine Glucose Negative  Fetal Status: Fetal Heart Rate (bpm): 145 Fundal Height: 34 cm Movement: Present     General:  Alert, oriented and cooperative. Patient is in no acute distress.  Skin: Skin is warm and dry. No rash noted.   Cardiovascular: Normal heart rate noted  Respiratory: Normal respiratory effort, no problems with respiration noted  Abdomen: Soft, gravid, appropriate for gestational age. Pain/Pressure: Absent     Pelvic:  Cervical exam deferred        Extremities: Normal range of motion.  Edema: None  Mental Status: Normal mood and affect. Normal behavior. Normal judgment and thought content.   Assessment   25 y.o. G3P0020 at [redacted]w[redacted]d by  04/15/2021, by Last Menstrual Period presenting for routine prenatal visit  Plan   Pregnancy #3 Problems (from 08/28/20 to present)     Problem Noted Resolved   HSV-1 (herpes  simplex virus 1) infection 09/06/2020 by [name], CNM No   Overview Signed 01/31/2021  9:18 AM by Nadara Mustard, MD    Prophylaxis at 36 weeks w Valtrex       Supervision of other normal pregnancy, antepartum 08/06/2018 by Conard Novak, MD No   Overview Addendum 02/14/2021  4:24 PM by Natale Milch, MD     Nursing Staff Provider  Office Location  [facility] Dating   LMP = 8wk [place]  Language  English Anatomy US   complete - needs third trimester [place] for renal pyelectasis  Flu Vaccine  no Genetic Screen  NIPS: negative x3, XY   TDaP vaccine   02/14/2021 Hgb A1C or  GTT Third trimester : nml  Rhogam   n/a   LAB RESULTS   Feeding Plan  breast Blood Type A/Positive/-- (01/12 1133)   Contraception  unsure Antibody Negative (01/12 1133)  Circumcision  Rubella 14.00 (01/12 1133)  Pediatrician    RPR Non Reactive (01/12 1133)   Support Person  boyfriend "Tyreek" HBsAg Negative (01/12 1133)   Prenatal Classes  information provided 11/02/20 HIV Non Reactive (01/12 1133)    Varicella  non-immune  BTL Consent  n/a GBS  (For PCN allergy, check sensitivities)        VBAC Consent  n/a Pap  08/06/18 NILM    Hgb Electro   normal adult hgb    CF      SMA                    Preterm labor symptoms and general obstetric precautions including but not limited to vaginal bleeding, contractions, leaking of fluid and fetal movement were reviewed in detail with the patient. Please refer to After  Visit Summary for other counseling recommendations.   Return in about 2 weeks (around 03/14/2021) for Routine Prenatal Appointment.   Thomasene Mohair, MD, [facility], [facility] 02/28/2021 4:55 PM

## 2021-03-09 ENCOUNTER — Telehealth: Payer: Self-pay

## 2021-03-09 NOTE — Telephone Encounter (Signed)
Patient is in the process of completing her birthing plan. She has a few questions about what all we provided. [phone]

## 2021-03-09 NOTE — Telephone Encounter (Signed)
Spoke w/patient. Advised per Tresea Mall, CNM waterbirths were stopped for Covid. [facility] is allowing now, however, they do not supply the tub for the birth and she believes both the patient and the provider have to take a class. None of the [facility] providers have taken the water birth class that we are aware of. L&D #given to patient to verify water birth questions.

## 2021-03-09 NOTE — Telephone Encounter (Signed)
Spoke w/patient. Inquiring if we do water births?

## 2021-03-15 ENCOUNTER — Ambulatory Visit (INDEPENDENT_AMBULATORY_CARE_PROVIDER_SITE_OTHER): Payer: BC Managed Care – PPO | Admitting: Advanced Practice Midwife

## 2021-03-15 ENCOUNTER — Other Ambulatory Visit: Payer: Self-pay

## 2021-03-15 ENCOUNTER — Encounter: Payer: Self-pay | Admitting: Advanced Practice Midwife

## 2021-03-15 VITALS — BP 113/71 | Wt 182.0 lb

## 2021-03-15 DIAGNOSIS — Z3A35 35 weeks gestation of pregnancy: Secondary | ICD-10-CM

## 2021-03-15 DIAGNOSIS — B009 Herpesviral infection, unspecified: Secondary | ICD-10-CM

## 2021-03-15 DIAGNOSIS — Z3483 Encounter for supervision of other normal pregnancy, third trimester: Secondary | ICD-10-CM

## 2021-03-15 LAB — POCT URINALYSIS DIPSTICK OB
Glucose, UA: NEGATIVE
POC,PROTEIN,UA: NEGATIVE

## 2021-03-15 MED ORDER — VALACYCLOVIR HCL 500 MG PO TABS: 500.0000 mg | ORAL_TABLET | Freq: Two times a day (BID) | ORAL | 6 refills | Status: DC

## 2021-03-15 NOTE — Progress Notes (Signed)
No Complaints

## 2021-03-15 NOTE — Patient Instructions (Signed)
Vaginal Delivery  Vaginal delivery means that you give birth by pushing your baby out of your birth canal (vagina). Your health care team will help you before, during, and after vaginaldelivery. Birth experiences are unique for every woman and every pregnancy, and birthexperiences vary depending on where you choose to give birth. What are the risks and benefits? Generally, this is safe. However, problems may occur, including: Bleeding. Infection. Damage to other structures such as vaginal tearing. Allergic reactions to medicines. Despite the risks, benefits of vaginal delivery include less risk of bleeding and infection and a shorter recovery time compared to a Cesarean delivery.Cesarean delivery, or C-section, is the surgical delivery of a baby. What happens when I arrive at the birth center or hospital? Once you are in labor and have been admitted into the hospital or birth center, your health care team may: Review your pregnancy history and any concerns that you have. Talk with you about your birth plan and discuss pain control options. Check your blood pressure, breathing, and heartbeat. Assess your baby's heartbeat. Monitor your uterus for contractions. Check whether your bag of water (amniotic sac) has broken (ruptured). Insert an IV into one of your veins. This may be used to give you fluids and medicines. Monitoring Your health care team may assess your contractions (uterine monitoring) and your baby's heart rate (fetal monitoring). You may need to be monitored: Often, but not continuously (intermittently). All the time or for long periods at a time (continuously). Continuous monitoring may be needed if: You are taking certain medicines, such as medicine to relieve pain or make your contractions stronger. You have pregnancy or labor complications. Monitoring may be done by: Placing a special stethoscope or a handheld monitoring device on your abdomen to check your baby's heartbeat  and to check for contractions. Placing monitors on your abdomen (external monitors) to record your baby's heartbeat and the frequency and length of contractions. Placing monitors inside your uterus through your vagina (internal monitors) to record your baby's heartbeat and the frequency, length, and strength of your contractions. Depending on the type of monitor, it may remain in your uterus or on your baby's head until birth. Telemetry. This is a type of continuous monitoring that can be done with external or internal monitors. Instead of having to stay in bed, you are able to move around. Physical exam Your health care team may perform frequent physical exams. This may include: Checking how and where your baby is positioned in your uterus. Checking your cervix to determine: Whether it is thinning out (effacing). Whether it is opening up (dilating). What happens during labor and delivery?  Normal labor and delivery is divided into the following three stages: Stage 1 This is the longest stage of labor. Throughout this stage, you will feel contractions. Contractions generally feel mild, infrequent, and irregular at first. They get stronger, more frequent, and more regular as you move through this stage. You may have contractions about every 2-3 minutes. This stage ends when your cervix is completely dilated to 4 inches (10 cm) and completely effaced. Stage 2 This stage starts once your cervix is completely effaced and dilated and lasts until the delivery of your baby. This is the stage where you will feel an urge to push your baby out of your vagina. You may feel stretching and burning pain, especially when the widest part of your baby's head passes through the vaginal opening (crowning). Once your baby is delivered, the umbilical cord will be clamped and cut.  Timing of cutting the cord will depend on your wishes, your baby's health, and your health care provider's practices. Your baby will be  placed on your bare chest (skin-to-skin contact) in an upright position and covered with a warm blanket. If you are choosing to breastfeed, watch your baby for feeding cues, like rooting or sucking, and help the baby to your breast for his or her first feeding. Stage 3 This stage starts immediately after the birth of your baby and ends after you deliver the placenta. This stage may take anywhere from 5 to 30 minutes. After your baby has been delivered, you will feel contractions as your body expels the placenta. These contractions also help your uterus get smaller and reduce bleeding. What can I expect after labor and delivery? After labor is over, you and your baby will be assessed closely until you are ready to go home. Your health care team will teach you how to care for yourself and your baby. You and your baby may be encouraged to stay in the same room (rooming in) during your hospital stay. This will help promote early bonding and successful breastfeeding. Your uterus will be checked and massaged regularly (fundal massage). You may continue to receive fluids and medicines through an IV. You will have some soreness and pain in your abdomen, vagina, and the area of skin between your vaginal opening and your anus (perineum). If an incision was made near your vagina (episiotomy) or if you had some vaginal tearing during delivery, cold compresses may be placed on your episiotomy or your tear. This helps to reduce pain and swelling. It is normal to have vaginal bleeding after delivery. Wear a sanitary pad for vaginal bleeding and discharge. Summary Vaginal delivery means that you will give birth by pushing your baby out of your birth canal (vagina). Your health care team will monitor you and your baby throughout the stages of labor. After you deliver your baby, your health care team will continue to assess you and your baby to ensure you are both recovering as expected after delivery. This  information is not intended to replace advice given to you by your health care provider. Make sure you discuss any questions you have with your healthcare provider. Document Revised: 07/10/2020 Document Reviewed: 07/10/2020 Elsevier Patient Education  2022 [employer] Pain Relief During Labor and Delivery Many things can cause pain during labor and delivery, including: Pressure due to the baby moving through the pelvis. Stretching of tissues due to the baby moving through the birth canal. Muscle tension due to anxiety or nervousness. The uterus tightening (contracting)and relaxing to help move the baby. How do I get pain relief during labor and delivery?  Discuss your pain relief options with your health care provider during your prenatal visits. Explore the options offered by your hospital or birth center. There are many ways to deal with the pain of labor and delivery. You can try relaxation techniques or doing relaxing activities, taking a warm shower or bath (hydrotherapy), or other methods. There are also many medicines available to help controlpain. Relaxation techniques and activities Practice relaxation techniques or do relaxing activities, such as: Focused breathing. Meditation. Visualization. Aroma therapy. Listening to your favorite music. Hypnosis. Hydrotherapy Take a warm shower or bath. This may: Provide comfort and relaxation. Lessen your feeling of pain. Reduce the amount of pain medicine needed. Shorten the length of labor. Other methods Try doing other things, such as: Getting a massage or having counterpressure on your back.  Applying warm packs or ice packs. Changing positions often, moving around, or using a birthing ball. Medicines You may be given: Pain medicine through an IV or an injection into a muscle. Pain medicine inserted into your spinal column. Injections of sterile water just under the skin on your lower back. Nitrous oxide inhalation therapy,  also called laughing gas. What kinds of medicine are available for pain relief? There are two kinds of medicines that can be used to relieve pain during labor and delivery: Analgesics. These medicines decrease pain without causing you to lose feeling or the ability to move your muscles. Anesthetics. These medicines block feeling in the body and can decrease your ability to move freely. Both kinds of medicine can cause minor side effects, such as nausea, trouble concentrating, and sleepiness. They can also affect the baby's heart rate before birth and his or her breathing after birth. For this reason, health careproviders are careful about when and how much medicine is given. Which medicines are used to provide pain relief? Common medicines The most common medicines used to help manage pain during labor and delivery include: Opioids. Opioids are medicines that decrease how much pain you feel (perception of pain). These medicines can be given through an IV or may be used with anesthetics to block pain. Epidural analgesia. Epidural analgesia is given through a very thin tube that is inserted into the lower back. Medicine is delivered continuously to the area near your spinal column nerves (epidural space). After having this treatment, you may be able to move your legs, but you will not be able to walk. Depending on the amount and type of medicine given, you may lose all feeling in the lower half of your body, or you may have some sensation, including the urge to push. This treatment can be used to give pain relief for a vaginal birth. Sometimes, a numbing medicine is injected into the spinal fluid when an epidural catheter is placed. This provides for immediate relief but only lasts for 1-2 hours. Once it wears off, the epidural will provide pain relief. This is called a combined spinal-epidural (CSE) block. Intrathecal analgesia (spinal analgesia). Intrathecal analgesia is similar to epidural analgesia,  but the medicine is injected into the spinal fluid instead of the epidural space. It is usually only given once. It starts to relieve pain quickly, but the pain relief lasts only 1-2 hours. Pudendal block. This block is done by injecting numbing medicine through the wall of the vagina and into a nerve in the pelvis. Other medicines Other medicines used to help manage pain during labor and delivery include: Local anesthetics. These are used to numb a small area of the body. They may be used along with another kind of medicine or used to numb the nerves of the vagina, cervix, and perineum during the second stage of labor. Spinal block (spinal anesthesia). Spinal anesthesia is similar to spinal analgesia, but the medicine that is used contains longer-acting numbing medicines and pain medicines. This type of anesthesia can be used for a cesarean delivery and allows you to stay awake for the birth of your baby. General anesthetics cause you to lose consciousness so you do not feel pain. They are usually only used for an emergency cesarean delivery. These medicines are given through an IV or a mask or both. These medicines are used as part of a procedure or for an emergency delivery. Summary Women have many options to help them manage the pain associated with labor and  delivery. You can try doing relaxing activities, taking a warm shower or bath, or other methods. There are also many medicines available to help control pain during labor and delivery. Talk with your health care provider about what options are available to you. This information is not intended to replace advice given to you by your health care provider. Make sure you discuss any questions you have with your healthcare provider. Document Revised: 06/30/2019 Document Reviewed: 06/30/2019 Elsevier Patient Education  2022 [employer].

## 2021-03-15 NOTE — Progress Notes (Signed)
Routine Prenatal Care Visit  Subjective  Faith Gonzales is a 25 y.o. G3P0020 at [redacted]w[redacted]d being seen today for ongoing prenatal care.  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  ----------------------------------------------------------------------------------- Patient reports no complaints.   Contractions: Not present. Vag. Bleeding: None.  Movement: Present. Leaking Fluid denies.  ----------------------------------------------------------------------------------- The following portions of the patient's history were reviewed and updated as appropriate: allergies, current medications, past family history, past medical history, past social history, past surgical history and problem list. Problem list updated.  Objective  Blood pressure 113/71, weight 182 lb (82.6 kg), last menstrual period 07/09/2020. Pregravid weight 154 lb (69.9 kg) Total Weight Gain 28 lb (12.7 kg) Urinalysis: Urine Protein Negative  Urine Glucose Negative  Fetal Status: Fetal Heart Rate (bpm): 139 Fundal Height: 36 cm Movement: Present     General:  Alert, oriented and cooperative. Patient is in no acute distress.  Skin: Skin is warm and dry. No rash noted.   Cardiovascular: Normal heart rate noted  Respiratory: Normal respiratory effort, no problems with respiration noted  Abdomen: Soft, gravid, appropriate for gestational age. Pain/Pressure: Absent     Pelvic:  Cervical exam deferred        Extremities: Normal range of motion.  Edema: None  Mental Status: Normal mood and affect. Normal behavior. Normal judgment and thought content.   Assessment   25 y.o. G3P0020 at [redacted]w[redacted]d by  04/15/2021, by Last Menstrual Period presenting for routine prenatal visit  Plan   Pregnancy #3 Problems (from 08/28/20 to present)     Problem Noted Resolved   HSV-1 (herpes  simplex virus 1) infection 09/06/2020 by [name], CNM No   Overview Signed 01/31/2021  9:18 AM by Nadara Mustard, MD    Prophylaxis at 36 weeks w Valtrex      Supervision of other normal pregnancy, antepartum 08/06/2018 by Conard Novak, MD No   Overview Addendum 02/14/2021  4:24 PM by Natale Milch, MD     Nursing Staff Provider  Office Location  [facility] Dating   LMP = 8wk [place]  Language  English Anatomy US   complete - needs third trimester [place] for renal pyelectasis  Flu Vaccine  no Genetic Screen  NIPS: negative x3, XY   TDaP vaccine   02/14/2021 Hgb A1C or  GTT Third trimester : nml  Rhogam   n/a   LAB RESULTS   Feeding Plan  breast Blood Type A/Positive/-- (01/12 1133)   Contraception  unsure Antibody Negative (01/12 1133)  Circumcision  Rubella 14.00 (01/12 1133)  Pediatrician    RPR Non Reactive (01/12 1133)   Support Person  boyfriend "Tyreek" HBsAg Negative (01/12 1133)   Prenatal Classes  information provided 11/02/20 HIV Non Reactive (01/12 1133)    Varicella  non-immune  BTL Consent  n/a GBS  (For PCN allergy, check sensitivities)        VBAC Consent  n/a Pap  08/06/18 NILM    Hgb Electro   normal adult hgb    CF      SMA                 HSV 1- RX valtrex suppression sent/MyChart message sent to patient to start taking in 3 days.   Preterm labor symptoms and general obstetric precautions including but not limited to vaginal bleeding, contractions, leaking of fluid  and fetal movement were reviewed in detail with the patient. Please refer to After Visit Summary for other counseling recommendations.   Return in about 1 week (around 03/22/2021) for rob weekly x3 visits.  Tresea Mall, CNM 03/15/2021 11:41 AM

## 2021-03-16 ENCOUNTER — Encounter: Payer: BC Managed Care – PPO | Admitting: Obstetrics and Gynecology

## 2021-03-21 ENCOUNTER — Other Ambulatory Visit (HOSPITAL_COMMUNITY)
Admission: RE | Admit: 2021-03-21 | Discharge: 2021-03-21 | Disposition: A | Discharge status: Home / Self Care | Payer: BC Managed Care – PPO | Source: Ambulatory Visit | Attending: Advanced Practice Midwife | Admitting: Advanced Practice Midwife

## 2021-03-21 ENCOUNTER — Encounter: Payer: Self-pay | Admitting: Advanced Practice Midwife

## 2021-03-21 ENCOUNTER — Ambulatory Visit (INDEPENDENT_AMBULATORY_CARE_PROVIDER_SITE_OTHER): Payer: BC Managed Care – PPO | Admitting: Advanced Practice Midwife

## 2021-03-21 ENCOUNTER — Other Ambulatory Visit: Payer: Self-pay

## 2021-03-21 VITALS — BP 116/72 | Wt 183.0 lb

## 2021-03-21 DIAGNOSIS — Z113 Encounter for screening for infections with a predominantly sexual mode of transmission: Secondary | ICD-10-CM

## 2021-03-21 DIAGNOSIS — Z3483 Encounter for supervision of other normal pregnancy, third trimester: Secondary | ICD-10-CM

## 2021-03-21 DIAGNOSIS — Z3A36 36 weeks gestation of pregnancy: Secondary | ICD-10-CM

## 2021-03-21 DIAGNOSIS — Z3685 Encounter for antenatal screening for Streptococcus B: Secondary | ICD-10-CM

## 2021-03-21 DIAGNOSIS — Z369 Encounter for antenatal screening, unspecified: Secondary | ICD-10-CM | POA: Diagnosis present

## 2021-03-21 LAB — OB RESULTS CONSOLE GC/CHLAMYDIA: Gonorrhea: NEGATIVE

## 2021-03-21 NOTE — Progress Notes (Signed)
Routine Prenatal Care Visit  Subjective  Faith Gonzales is a 25 y.o. G3P0020 at [redacted]w[redacted]d being seen today for ongoing prenatal care.  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  ----------------------------------------------------------------------------------- Patient reports no complaints.  She started taking valtrex. Contractions: Not present. Vag. Bleeding: None.  Movement: Present. Leaking Fluid denies.  ----------------------------------------------------------------------------------- The following portions of the patient's history were reviewed and updated as appropriate: allergies, current medications, past family history, past medical history, past social history, past surgical history and problem list. Problem list updated.  Objective  Blood pressure 116/72, weight 183 lb (83 kg), last menstrual period 07/09/2020. Pregravid weight 154 lb (69.9 kg) Total Weight Gain 29 lb (13.2 kg) Urinalysis: Urine Protein    Urine Glucose    Fetal Status: Fetal Heart Rate (bpm): 144 Fundal Height: 36 cm Movement: Present     General:  Alert, oriented and cooperative. Patient is in no acute distress.  Skin: Skin is warm and dry. No rash noted.   Cardiovascular: Normal heart rate noted  Respiratory: Normal respiratory effort, no problems with respiration noted  Abdomen: Soft, gravid, appropriate for gestational age. Pain/Pressure: Absent     Pelvic:  Cervical exam deferred      GBS, Aptima collected  Extremities: Normal range of motion.  Edema: None  Mental Status: Normal mood and affect. Normal behavior. Normal judgment and thought content.   Assessment   25 y.o. G3P0020 at [redacted]w[redacted]d by  04/15/2021, by Last Menstrual Period presenting for routine prenatal visit  Plan   Pregnancy #3 Problems (from 08/28/20 to present)    Problem Noted  Resolved   HSV-1 (herpes simplex virus 1) infection 09/06/2020 by [name], CNM No   Overview Signed 01/31/2021  9:18 AM by Nadara Mustard, MD    Prophylaxis at 36 weeks w Valtrex      Supervision of other normal pregnancy, antepartum 08/06/2018 by Conard Novak, MD No   Overview Addendum 02/14/2021  4:24 PM by Natale Milch, MD     Nursing Staff Provider  Office Location  [facility] Dating   LMP = 8wk [place]  Language  English Anatomy US   complete - needs third trimester [place] for renal pyelectasis  Flu Vaccine  no Genetic Screen  NIPS: negative x3, XY   TDaP vaccine   02/14/2021 Hgb A1C or  GTT Third trimester : nml  Rhogam   n/a   LAB RESULTS   Feeding Plan  breast Blood Type A/Positive/-- (01/12 1133)   Contraception  unsure Antibody Negative (01/12 1133)  Circumcision  Rubella 14.00 (01/12 1133)  Pediatrician    RPR Non Reactive (01/12 1133)   Support Person  boyfriend "Tyreek" HBsAg Negative (01/12 1133)   Prenatal Classes  information provided 11/02/20 HIV Non Reactive (01/12 1133)    Varicella  non-immune  BTL Consent  n/a GBS  (For PCN allergy, check sensitivities)        VBAC Consent  n/a Pap  08/06/18 NILM    Hgb Electro   normal adult hgb    CF      SMA                   Preterm labor symptoms and general obstetric precautions including but not limited to vaginal bleeding, contractions, leaking of fluid and fetal movement were reviewed in detail with the patient.  Please refer to After Visit Summary for other counseling recommendations.   Return for scheduled prenatal visits.  Tresea Mall, CNM 03/21/2021 2:36 PM

## 2021-03-21 NOTE — Progress Notes (Signed)
ROB - GBS, no concerns. Procedure RM 

## 2021-03-21 NOTE — Patient Instructions (Addendum)
Signs and Symptoms of Labor Labor is the body's natural process of moving the baby and the placenta out of the uterus. The process of labor usually starts when the baby is full-term, between 37 and 40 weeks of pregnancy. Signs and symptoms that you are close to going into labor As your body prepares for labor and the birth of your baby, you may notice the following symptoms in the weeks and days before true labor starts: Passing a small amount of thick, bloody mucus from your vagina. This is called normal bloody show or losing your mucus plug. This may happen more than a week before labor begins, or right before labor begins, as the opening of the cervix starts to widen (dilate). For some women, the entire mucus plug passes at once. For others, pieces of the mucus plug may gradually pass over several days. Your baby moving (dropping) lower in your pelvis to get into position for birth (lightening). When this happens, you may feel more pressure on your bladder and pelvic bone and less pressure on your ribs. This may make it easier to breathe. It may also cause you to need to urinate more often and have problems with bowel movements. Having "practice contractions," also called Braxton Hicks contractions or false labor. These occur at irregular (unevenly spaced) intervals that are more than 10 minutes apart. False labor contractions are common after exercise or sexual activity. They will stop if you change position, rest, or drink fluids. These contractions are usually mild and do not get stronger over time. They may feel like: A backache or back pain. Mild cramps, similar to menstrual cramps. Tightening or pressure in your abdomen. Other early symptoms include: Nausea or loss of appetite. Diarrhea. Having a sudden burst of energy, or feeling very tired. Mood changes. Having trouble sleeping. Signs and symptoms that labor has begun Signs that you are in labor may include: Having contractions that come  at regular (evenly spaced) intervals and increase in intensity. This may feel like more intense tightening or pressure in your abdomen that moves to your back. Contractions may also feel like rhythmic pain in your upper thighs or back that comes and goes at regular intervals. For first-time mothers, this change in intensity of contractions often occurs at a more gradual pace. Women who have given birth before may notice a more rapid progression of contraction changes. Feeling pressure in the vaginal area. Your water breaking (rupture of membranes). This is when the sac of fluid that surrounds your baby breaks. Fluid leaking from your vagina may be clear or blood-tinged. Labor usually starts within 24 hours of your water breaking, but it may take longer to begin. Some women may feel a sudden gush of fluid. Others notice that their underwear repeatedly becomes damp. Follow these instructions at home:  When labor starts, or if your water breaks, call your health care provider or nurse care line. Based on your situation, they will determine when you should go in for an exam. During early labor, you may be able to rest and manage symptoms at home. Some strategies to try at home include: Breathing and relaxation techniques. Taking a warm bath or shower. Listening to music. Using a heating pad on the lower back for pain. If you are directed to use heat: Place a towel between your skin and the heat source. Leave the heat on for 20-30 minutes. Remove the heat if your skin turns bright red. This is especially important if you are unable to   feel pain, heat, or cold. You may have a greater risk of getting burned. Contact a health care provider if: Your labor has started. Your water breaks. Get help right away if: You have painful, regular contractions that are 5 minutes apart or less. Labor starts before you are [redacted] weeks along in your pregnancy. You have a fever. You have bright red blood coming from  your vagina. You do not feel your baby moving. You have a severe headache with or without vision problems. You have severe nausea, vomiting, or diarrhea. You have chest pain or shortness of breath. These symptoms may represent a serious problem that is an emergency. Do not wait to see if the symptoms will go away. Get medical help right away. Call your local emergency services (911 in the [place] Do not drive yourself to the hospital. Summary Labor is your body's natural process of moving your baby and the placenta out of your uterus. The process of labor usually starts when your baby is full-term, between 37 and 40 weeks of pregnancy. When labor starts, or if your water breaks, call your health care provider or nurse care line. Based on your situation, they will determine when you should go in for an exam. This information is not intended to replace advice given to you by your health care provider. Make sure you discuss any questions you have with your health care provider. Document Revised: 06/03/2020 Document Reviewed: 06/03/2020 Elsevier Patient Education  2022 [employer] Inc[employer]  

## 2021-03-23 LAB — CERVICOVAGINAL ANCILLARY ONLY
Chlamydia: NEGATIVE
Comment: NEGATIVE
Comment: NEGATIVE
Comment: NORMAL
Neisseria Gonorrhea: NEGATIVE
Trichomonas: NEGATIVE

## 2021-03-23 LAB — STREP GP B NAA: Strep Gp B NAA: NEGATIVE

## 2021-03-29 ENCOUNTER — Ambulatory Visit (INDEPENDENT_AMBULATORY_CARE_PROVIDER_SITE_OTHER): Payer: BC Managed Care – PPO | Admitting: Obstetrics and Gynecology

## 2021-03-29 ENCOUNTER — Other Ambulatory Visit: Payer: Self-pay

## 2021-03-29 VITALS — BP 123/79 | Wt 185.0 lb

## 2021-03-29 DIAGNOSIS — Z3480 Encounter for supervision of other normal pregnancy, unspecified trimester: Secondary | ICD-10-CM

## 2021-03-29 DIAGNOSIS — Z3A37 37 weeks gestation of pregnancy: Secondary | ICD-10-CM

## 2021-03-29 LAB — POCT URINALYSIS DIPSTICK OB
Glucose, UA: NEGATIVE
POC,PROTEIN,UA: NEGATIVE

## 2021-03-29 NOTE — Progress Notes (Signed)
Routine Prenatal Care Visit  Subjective  Faith Gonzales is a 25 y.o. G3P0020 at [redacted]w[redacted]d being seen today for ongoing prenatal care.  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  ----------------------------------------------------------------------------------- Patient reports no complaints.   Contractions: Not present. Vag. Bleeding: None.  Movement: Present. Denies leaking of fluid.  ----------------------------------------------------------------------------------- The following portions of the patient's history were reviewed and updated as appropriate: allergies, current medications, past family history, past medical history, past social history, past surgical history and problem list. Problem list updated.   Objective  Blood pressure 123/79, weight 185 lb (83.9 kg), last menstrual period 07/09/2020. Pregravid weight 154 lb (69.9 kg) Total Weight Gain 31 lb (14.1 kg) Urinalysis:      Fetal Status: Fetal Heart Rate (bpm): 135 Fundal Height: 36 cm Movement: Present     General:  Alert, oriented and cooperative. Patient is in no acute distress.  Skin: Skin is warm and dry. No rash noted.   Cardiovascular: Normal heart rate noted  Respiratory: Normal respiratory effort, no problems with respiration noted  Abdomen: Soft, gravid, appropriate for gestational age. Pain/Pressure: Absent     Pelvic:  Cervical exam deferred        Extremities: Normal range of motion.     ental Status: Normal mood and affect. Normal behavior. Normal judgment and thought content.     Assessment   25 y.o. G3P0020 at [redacted]w[redacted]d by  04/15/2021, by Last Menstrual Period presenting for routine prenatal visit  Plan   Pregnancy #3 Problems (from 08/28/20 to present)     Problem Noted Resolved   HSV-1 (herpes simplex virus 1) infection 09/06/2020 by [name], CNM No   Overview Signed 01/31/2021  9:18 AM by Nadara Mustard, MD    Prophylaxis at 36 weeks w Valtrex       Supervision of other normal pregnancy, antepartum 08/06/2018 by Conard Novak, MD No   Overview Addendum 02/14/2021  4:24 PM by Natale Milch, MD     Nursing Staff Provider  Office Location  [facility] Dating   LMP = 8wk [place]  Language  English Anatomy US   complete - needs third trimester [place] for renal pyelectasis  Flu Vaccine  no Genetic Screen  NIPS: negative x3, XY   TDaP vaccine   02/14/2021 Hgb A1C or  GTT Third trimester : nml  Rhogam   n/a   LAB RESULTS   Feeding Plan  breast Blood Type A/Positive/-- (01/12 1133)   Contraception  unsure Antibody Negative (01/12 1133)  Circumcision  Rubella 14.00 (01/12 1133)  Pediatrician    RPR Non Reactive (01/12 1133)   Support Person  boyfriend "Tyreek" HBsAg Negative (01/12 1133)   Prenatal Classes  information provided 11/02/20 HIV Non Reactive (01/12 1133)    Varicella  non-immune  BTL Consent  n/a GBS  (For PCN allergy, check sensitivities)        VBAC Consent  n/a Pap  08/06/18 NILM    Hgb Electro   normal adult hgb    CF      SMA                   Gestational age appropriate obstetric precautions including but not limited to vaginal bleeding, contractions, leaking of fluid and fetal movement were reviewed in detail with the patient.    -  discussed 39 week IOL, vs stripping membranes, vs expectant management to 41 weeks with patient  Return in about 1 week (around 04/05/2021) for ROB.  Vena Austria, MD, [facility], [facility] 03/29/2021, 12:08 PM

## 2021-03-29 NOTE — Progress Notes (Signed)
ROB - no concerns. RM 2 

## 2021-04-04 ENCOUNTER — Ambulatory Visit (INDEPENDENT_AMBULATORY_CARE_PROVIDER_SITE_OTHER): Payer: BC Managed Care – PPO | Admitting: Advanced Practice Midwife

## 2021-04-04 ENCOUNTER — Other Ambulatory Visit: Payer: Self-pay

## 2021-04-04 ENCOUNTER — Encounter: Payer: Self-pay | Admitting: Advanced Practice Midwife

## 2021-04-04 VITALS — BP 125/67 | Wt 186.0 lb

## 2021-04-04 DIAGNOSIS — Z3A38 38 weeks gestation of pregnancy: Secondary | ICD-10-CM

## 2021-04-04 DIAGNOSIS — Z3483 Encounter for supervision of other normal pregnancy, third trimester: Secondary | ICD-10-CM

## 2021-04-04 LAB — POCT URINALYSIS DIPSTICK OB
Glucose, UA: NEGATIVE
POC,PROTEIN,UA: NEGATIVE

## 2021-04-04 NOTE — Progress Notes (Signed)
ROB - no concerns. RM 2 

## 2021-04-04 NOTE — Progress Notes (Addendum)
Routine Prenatal Care Visit  Subjective  Faith Gonzales is a 25 y.o. G3P0020 at [redacted]w[redacted]d being seen today for ongoing prenatal care.  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  ----------------------------------------------------------------------------------- Patient reports no complaints.  She is still unsure which form of birth control she wants to use. Options reviewed. Contractions: Not present. Vag. Bleeding: None.  Movement: Present. Leaking Fluid denies.  ----------------------------------------------------------------------------------- The following portions of the patient's history were reviewed and updated as appropriate: allergies, current medications, past family history, past medical history, past social history, past surgical history and problem list. Problem list updated.  Objective  Blood pressure 125/67, weight 186 lb (84.4 kg), last menstrual period 07/09/2020. Pregravid weight 154 lb (69.9 kg) Total Weight Gain 32 lb (14.5 kg) Urinalysis: Urine Protein Negative  Urine Glucose Negative  Fetal Status: Fetal Heart Rate (bpm): 129 Fundal Height: 38 cm Movement: Present     General:  Alert, oriented and cooperative. Patient is in no acute distress.  Skin: Skin is warm and dry. No rash noted.   Cardiovascular: Normal heart rate noted  Respiratory: Normal respiratory effort, no problems with respiration noted  Abdomen: Soft, gravid, appropriate for gestational age. Pain/Pressure: Absent     Pelvic:  Cervical exam deferred        Extremities: Normal range of motion.  Edema: None  Mental Status: Normal mood and affect. Normal behavior. Normal judgment and thought content.   Assessment   25 y.o. G3P0020 at [redacted]w[redacted]d by  04/15/2021, by Last Menstrual Period presenting for routine prenatal visit  Plan   Pregnancy #3  Problems (from 08/28/20 to present)     Problem Noted Resolved   HSV-1 (herpes simplex virus 1) infection 09/06/2020 by [name], CNM No   Overview Signed 01/31/2021  9:18 AM by Nadara Mustard, MD    Prophylaxis at 36 weeks w Valtrex      Supervision of other normal pregnancy, antepartum 08/06/2018 by Conard Novak, MD No   Overview Addendum 02/14/2021  4:24 PM by Natale Milch, MD     Nursing Staff Provider  Office Location  [facility] Dating   LMP = 8wk [place]  Language  English Anatomy US   complete - needs third trimester [place] for renal pyelectasis  Flu Vaccine  no Genetic Screen  NIPS: negative x3, XY   TDaP vaccine   02/14/2021 Hgb A1C or  GTT Third trimester : nml  Rhogam   n/a   LAB RESULTS   Feeding Plan  breast Blood Type A/Positive/-- (01/12 1133)   Contraception  unsure Antibody Negative (01/12 1133)  Circumcision  Rubella 14.00 (01/12 1133)  Pediatrician    RPR Non Reactive (01/12 1133)   Support Person  boyfriend "Tyreek" HBsAg Negative (01/12 1133)   Prenatal Classes  information provided 11/02/20 HIV Non Reactive (01/12 1133)    Varicella  non-immune  BTL Consent  n/a GBS  (For PCN allergy, check sensitivities)        VBAC Consent  n/a Pap  08/06/18 NILM    Hgb Electro   normal adult hgb    CF      SMA                    Term labor symptoms and general obstetric precautions including but not limited to vaginal bleeding, contractions, leaking of fluid and  fetal movement were reviewed in detail with the patient. Please refer to After Visit Summary for other counseling recommendations.   Return in about 1 week (around 04/11/2021) for rob.  Tresea Mall, CNM 04/04/2021 2:25 PM

## 2021-04-13 ENCOUNTER — Ambulatory Visit (INDEPENDENT_AMBULATORY_CARE_PROVIDER_SITE_OTHER): Payer: BC Managed Care – PPO | Admitting: Obstetrics

## 2021-04-13 ENCOUNTER — Other Ambulatory Visit: Payer: Self-pay

## 2021-04-13 VITALS — BP 122/78 | Wt 190.0 lb

## 2021-04-13 DIAGNOSIS — Z3483 Encounter for supervision of other normal pregnancy, third trimester: Secondary | ICD-10-CM

## 2021-04-13 DIAGNOSIS — Z3480 Encounter for supervision of other normal pregnancy, unspecified trimester: Secondary | ICD-10-CM

## 2021-04-13 DIAGNOSIS — Z3A39 39 weeks gestation of pregnancy: Secondary | ICD-10-CM

## 2021-04-13 NOTE — Progress Notes (Signed)
Routine Prenatal Care Visit  Subjective  Faith Gonzales is a 25 y.o. G3P0020 at [redacted]w[redacted]d being seen today for ongoing prenatal care.  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  ------------------------------------------------ Contractions: Not present. Vag. Bleeding: None.  Movement: Present. Leaking Fluid denies.  ----------------------------------------------------------------------------------- The following portions of the patient's history were reviewed and updated as appropriate: allergies, current medications, past family history, past medical history, past social history, past surgical history and problem list. Problem list updated.  Objective  Blood pressure 122/78, weight 190 lb (86.2 kg), last menstrual period 07/09/2020. Pregravid weight 154 lb (69.9 kg) Total Weight Gain 36 lb (16.3 kg) Urinalysis: Urine Protein    Urine Glucose    Fetal Status:     Movement: Present     General:  Alert, oriented and cooperative. Patient is in no acute distress.  Skin: Skin is warm and dry. No rash noted.   Cardiovascular: Normal heart rate noted  Respiratory: Normal respiratory effort, no problems with respiration noted  Abdomen: Soft, gravid, appropriate for gestational age. Pain/Pressure: Absent     Pelvic:  Cervical exam performed      cervix is posterior, closed/50%/ballotable. Vertex confirmed with sono  Extremities: Normal range of motion.     Mental Status: Normal mood and affect. Normal behavior. Normal judgment and thought content.   Assessment   25 y.o. G3P0020 at [redacted]w[redacted]d by  04/15/2021, by Last Menstrual Period presenting for routine prenatal visit  Plan   Pregnancy #3 Problems (from 08/28/20 to present)    Problem Noted Resolved   HSV-1 (herpes simplex virus 1) infection 09/06/2020 by [name], CNM No   Overview  Signed 01/31/2021  9:18 AM by Nadara Mustard, MD    Prophylaxis at 36 weeks w Valtrex      Supervision of other normal pregnancy, antepartum 08/06/2018 by Conard Novak, MD No   Overview Addendum 04/13/2021 11:28 AM by Mirna Mires, CNM     Nursing Staff Provider  Office Location  [facility] Dating   LMP = 8wk [place]  Language  English Anatomy US   complete - needs third trimester [place] for renal pyelectasis  Flu Vaccine  no Genetic Screen  NIPS: negative x3, XY   TDaP vaccine   02/14/2021 Hgb A1C or  GTT Third trimester : nml  Rhogam   n/a   LAB RESULTS   Feeding Plan  breast Blood Type A/Positive/-- (01/12 1133)   Contraception  unsure Antibody Negative (01/12 1133)  Circumcision  Rubella 14.00 (01/12 1133)  Pediatrician    RPR Non Reactive (01/12 1133)   Support Person  boyfriend "Tyreek" HBsAg Negative (01/12 1133)   Prenatal Classes  information provided 11/02/20 HIV negative    Varicella   BTL Consent  n/a GBS        VBAC Consent  n/a Pap  08/06/18 NILM    Hgb Electro   normal adult hgb    CF      SMA                   Term labor symptoms and general obstetric precautions including but not limited to vaginal bleeding, contractions, leaking of fluid and fetal movement were reviewed in detail with the patient. Please refer to After Visit Summary for other counseling recommendations.   Return in about 5 days (around 04/18/2021) for  return OB, SVE discuss IOL.  Mirna Mires, CNM  04/13/2021 11:44 AM

## 2021-04-13 NOTE — Progress Notes (Signed)
Cervical check today. No vb. No lof  

## 2021-04-18 ENCOUNTER — Ambulatory Visit (INDEPENDENT_AMBULATORY_CARE_PROVIDER_SITE_OTHER): Payer: BC Managed Care – PPO | Admitting: Obstetrics

## 2021-04-18 ENCOUNTER — Other Ambulatory Visit: Payer: Self-pay

## 2021-04-18 VITALS — BP 122/74 | Wt 191.0 lb

## 2021-04-18 DIAGNOSIS — Z3A40 40 weeks gestation of pregnancy: Secondary | ICD-10-CM

## 2021-04-18 DIAGNOSIS — Z3480 Encounter for supervision of other normal pregnancy, unspecified trimester: Secondary | ICD-10-CM

## 2021-04-18 NOTE — Progress Notes (Signed)
Routine Prenatal Care Visit  Subjective  Faith Gonzales is a 25 y.o. G3P0020 at [redacted]w[redacted]d being seen today for ongoing prenatal care.  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  ----------------------------------------------------------------------------------- Patient reports no complaints.  Asking for induction date. Contractions: Not present. Vag. Bleeding: None.  Movement: Present. Leaking Fluid denies.  ----------------------------------------------------------------------------------- The following portions of the patient's history were reviewed and updated as appropriate: allergies, current medications, past family history, past medical history, past social history, past surgical history and problem list. Problem list updated.  Objective  Blood pressure 122/74, weight 191 lb (86.6 kg), last menstrual period 07/09/2020. Pregravid weight 154 lb (69.9 kg) Total Weight Gain 37 lb (16.8 kg) Urinalysis: Urine Protein    Urine Glucose    Fetal Status:     Movement: Present     General:  Alert, oriented and cooperative. Patient is in no acute distress.  Skin: Skin is warm and dry. No rash noted.   Cardiovascular: Normal heart rate noted  Respiratory: Normal respiratory effort, no problems with respiration noted  Abdomen: Soft, gravid, appropriate for gestational age. Pain/Pressure: Absent     Pelvic:  Cervical exam performed      cervix is midway, closed/50%/ballotable.  Extremities: Normal range of motion.     Mental Status: Normal mood and affect. Normal behavior. Normal judgment and thought content.   Assessment   25 y.o. G3P0020 at [redacted]w[redacted]d by  04/15/2021, by Last Menstrual Period presenting for routine prenatal visit  Plan   Pregnancy #3 Problems (from 08/28/20 to present)    Problem Noted Resolved   HSV-1 (herpes simplex  virus 1) infection 09/06/2020 by [name], CNM No   Overview Signed 01/31/2021  9:18 AM by Nadara Mustard, MD    Prophylaxis at 36 weeks w Valtrex      Supervision of other normal pregnancy, antepartum 08/06/2018 by Conard Novak, MD No   Overview Addendum 04/18/2021  4:32 PM by Mirna Mires, CNM     Nursing Staff Provider  Office Location  [facility] Dating   LMP = 8wk [place]  Language  English Anatomy US   complete - needs third trimester [place] for renal pyelectasis  Flu Vaccine  no Genetic Screen  NIPS: negative x3, XY   TDaP vaccine   02/14/2021 Hgb A1C or  GTT Third trimester : nml  Rhogam   n/a   LAB RESULTS   Feeding Plan  breast Blood Type A/Positive/-- (01/12 1133)   Contraception  unsure Antibody Negative (01/12 1133)  Circumcision  Rubella 14.00 (01/12 1133)  Pediatrician    RPR Non Reactive (01/12 1133)   Support Person  boyfriend "Tyreek" HBsAg Negative (01/12 1133)   Prenatal Classes  information provided 11/02/20 HIV negative    Varicella   BTL Consent  n/a GBS negative       VBAC Consent  n/a Pap  08/06/18 NILM    Hgb Electro   normal adult hgb    CF      SMA                   Term labor symptoms and general obstetric precautions including but not limited to vaginal bleeding, contractions, leaking of fluid and fetal movement were reviewed in detail with the patient. Please refer to After Visit Summary for other counseling recommendations.  [facility] is called  and she is set up for IOL on 04/23/21 at 0500 use of Cytotec and pitocin   Return if symptoms worsen or fail to improve.  Mirna Mires, CNM  04/18/2021 4:43 PM

## 2021-04-18 NOTE — Progress Notes (Signed)
No vb. No lof.  

## 2021-04-18 NOTE — H&P (Deleted)
  The note originally documented on this encounter has been moved the the encounter in which it belongs.  

## 2021-04-18 NOTE — H&P (Addendum)
Faith Gonzales is a 25 y.o. female presenting in early labor. She was scheduled for induction  of labor in two days. She reports the onset of painful contractions early this morning at approximately 0300. She has seen some bloody show, and denies any active LOF. Her baby has been moving well. She took Valtrex yesterday. OB History     Gravida  3   Para      Term      Preterm      AB  2   Living         SAB      IAB  1   Ectopic      Multiple      Live Births             Past Medical History:  Diagnosis Date   No known health problems    Past Surgical History:  Procedure Laterality Date   NO PAST SURGERIES     WISDOM TOOTH EXTRACTION  12/09/2019   Family History: family history includes Diabetes in her mother; Prostate cancer in her father. Social History:  reports that she has never smoked. She has never used smokeless tobacco. She reports that she does not currently use alcohol. She reports that she does not use drugs.     Maternal Diabetes: No Genetic Screening: Normal Maternal Ultrasounds/Referrals: Normal Fetal Ultrasounds or other Referrals:  None Maternal Substance Abuse:  No Significant Maternal Medications:  None Significant Maternal Lab Results:  Group B Strep negative Other Comments:  None  Review of Systems  Constitutional: Negative.   HENT: Negative.    Eyes: Negative.   Respiratory: Negative.    Cardiovascular: Negative.   Gastrointestinal: Negative.   Endocrine: Negative.   Genitourinary: Negative.   All other systems reviewed and are negative. History Dilation: Closed Effacement (%): 50 Station: Ballotable Blood pressure 122/74, weight 191 lb (86.6 kg), last menstrual period 07/09/2020. Maternal Exam:  Uterine Assessment: Contraction strength is mild.  Contraction frequency is rare.  Abdomen: Estimated fetal weight is 7.5lbs.   Fetal presentation: vertex Introitus: Normal vulva. Normal vagina.  Ferning test: not done.  Nitrazine  test: not done. Pelvis: adequate for delivery.   Cervix: Cervix evaluated by digital exam.    Physical Exam Constitutional:      Appearance: Normal appearance.  HENT:     Head: Normocephalic and atraumatic.  Cardiovascular:     Rate and Rhythm: Normal rate and regular rhythm.     Pulses: Normal pulses.     Heart sounds: Normal heart sounds.  Pulmonary:     Effort: Pulmonary effort is normal.     Breath sounds: Normal breath sounds.  Abdominal:     Comments: Gravid abdomen. FH is 38 cms  Genitourinary:    General: Normal vulva.     Rectum: Normal.  Musculoskeletal:        General: Normal range of motion.     Cervical back: Normal range of motion and neck supple.  Skin:    General: Skin is warm and dry.  Neurological:     General: No focal deficit present.     Mental Status: She is alert and oriented to person, place, and time.  Psychiatric:        Mood and Affect: Mood normal.        Behavior: Behavior normal.    Prenatal labs: ABO, Rh: A/Positive/-- (01/12 1133) Antibody: Negative (01/12 1133) Rubella: 14.00 (01/12 1133) RPR: Non Reactive (06/08 0927)  HBsAg: Negative (  01/12 1133)  HIV: Non Reactive (06/08 0927)  GBS: Negative/-- (07/27 1438)   Assessment/Plan: IUP 40 weeks 6 days, in early labor.  Routine labs, IV access EFM Will manage expectantly; augment with pitocin PRN. She plans epidural anesthesia.  May have regular diet until pitocin is started. Anticipate SVD.    Mirna Mires 04/18/2021, 5:24 PM

## 2021-04-19 NOTE — Telephone Encounter (Signed)
Patient is calling requesting copies of her FMLA. Patient is aware the forms have been sent for processing to add to her chart. Could you keep an eye out for the forms posting to patient's chart so we can print them?

## 2021-04-20 ENCOUNTER — Other Ambulatory Visit: Payer: Self-pay

## 2021-04-20 ENCOUNTER — Other Ambulatory Visit
Admission: RE | Admit: 2021-04-20 | Discharge: 2021-04-20 | Disposition: A | Discharge status: Home / Self Care | Payer: BC Managed Care – PPO | Source: Ambulatory Visit | Attending: Obstetrics | Admitting: Obstetrics

## 2021-04-20 ENCOUNTER — Telehealth: Payer: Self-pay

## 2021-04-20 DIAGNOSIS — Z01812 Encounter for preprocedural laboratory examination: Secondary | ICD-10-CM | POA: Insufficient documentation

## 2021-04-20 DIAGNOSIS — Z20822 Contact with and (suspected) exposure to covid-19: Secondary | ICD-10-CM | POA: Insufficient documentation

## 2021-04-20 NOTE — Telephone Encounter (Signed)
Pt calling; has questions about covid testing during labor and delivery.  [phone]  Adv pt I did not know what their protocol was; number given for pt to call L&D with her covid questions.

## 2021-04-21 ENCOUNTER — Inpatient Hospital Stay
Admission: EM | Admit: 2021-04-21 | Discharge: 2021-04-23 | DRG: 788 | Disposition: A | Discharge status: Home / Self Care | Payer: BC Managed Care – PPO | Attending: Obstetrics and Gynecology | Admitting: Obstetrics and Gynecology

## 2021-04-21 ENCOUNTER — Other Ambulatory Visit: Payer: Self-pay

## 2021-04-21 ENCOUNTER — Inpatient Hospital Stay: Payer: BC Managed Care – PPO | Admitting: Anesthesiology

## 2021-04-21 ENCOUNTER — Encounter
Admission: EM | Disposition: A | Discharge status: Home / Self Care | Payer: Self-pay | Source: Home / Self Care | Attending: Obstetrics and Gynecology

## 2021-04-21 ENCOUNTER — Encounter: Payer: Self-pay | Admitting: Obstetrics and Gynecology

## 2021-04-21 DIAGNOSIS — Z3A40 40 weeks gestation of pregnancy: Secondary | ICD-10-CM

## 2021-04-21 DIAGNOSIS — Z20822 Contact with and (suspected) exposure to covid-19: Secondary | ICD-10-CM | POA: Diagnosis present

## 2021-04-21 DIAGNOSIS — B009 Herpesviral infection, unspecified: Secondary | ICD-10-CM

## 2021-04-21 DIAGNOSIS — O480 Post-term pregnancy: Principal | ICD-10-CM

## 2021-04-21 DIAGNOSIS — Z3490 Encounter for supervision of normal pregnancy, unspecified, unspecified trimester: Secondary | ICD-10-CM | POA: Diagnosis present

## 2021-04-21 DIAGNOSIS — Z3480 Encounter for supervision of other normal pregnancy, unspecified trimester: Secondary | ICD-10-CM

## 2021-04-21 HISTORY — DX: Other specified health status: Z78.9

## 2021-04-21 LAB — CBC
HCT: 38.8 % (ref 36.0–46.0)
Hemoglobin: 14 g/dL (ref 12.0–15.0)
MCH: 35.3 pg — ABNORMAL HIGH (ref 26.0–34.0)
MCHC: 36.1 g/dL — ABNORMAL HIGH (ref 30.0–36.0)
MCV: 97.7 fL (ref 80.0–100.0)
Platelets: 160 10*3/uL (ref 150–400)
RBC: 3.97 MIL/uL (ref 3.87–5.11)
RDW: 13.2 % (ref 11.5–15.5)
WBC: 8.5 10*3/uL (ref 4.0–10.5)
nRBC: 0 % (ref 0.0–0.2)

## 2021-04-21 LAB — TYPE AND SCREEN
ABO/RH(D): A POS
Antibody Screen: NEGATIVE

## 2021-04-21 LAB — SARS CORONAVIRUS 2 (TAT 6-24 HRS): SARS Coronavirus 2: NEGATIVE

## 2021-04-21 SURGERY — Surgical Case
Anesthesia: Epidural

## 2021-04-21 MED ORDER — EPHEDRINE 5 MG/ML INJ: 10.0000 mg | INTRAVENOUS | Status: DC | PRN

## 2021-04-21 MED ORDER — BUPIVACAINE 0.25 % ON-Q PUMP DUAL CATH 400 ML
400.0000 mL | INJECTION | Status: DC
  Filled 2021-04-21: qty 400

## 2021-04-21 MED ORDER — SCOPOLAMINE 1 MG/3DAYS TD PT72
1.0000 | MEDICATED_PATCH | Freq: Once | TRANSDERMAL | Status: DC
  Administered 2021-04-21: 1.5 mg via TRANSDERMAL
  Filled 2021-04-21: qty 1

## 2021-04-21 MED ORDER — SODIUM CHLORIDE 0.9 % IV SOLN
500.0000 mg | INTRAVENOUS | Status: AC
  Administered 2021-04-21: 500 mg via INTRAVENOUS

## 2021-04-21 MED ORDER — FENTANYL CITRATE (PF) 100 MCG/2ML IJ SOLN
INTRAMUSCULAR | Status: AC
  Filled 2021-04-21: qty 2

## 2021-04-21 MED ORDER — OXYTOCIN-SODIUM CHLORIDE 30-0.9 UT/500ML-% IV SOLN
2.5000 [IU]/h | INTRAVENOUS | Status: DC
  Administered 2021-04-21: 30 [IU] via INTRAVENOUS
  Filled 2021-04-21: qty 500

## 2021-04-21 MED ORDER — PRENATAL MULTIVITAMIN CH
1.0000 | ORAL_TABLET | Freq: Every day | ORAL | Status: DC
  Administered 2021-04-22: 1 via ORAL
  Filled 2021-04-21: qty 1

## 2021-04-21 MED ORDER — DEXAMETHASONE SODIUM PHOSPHATE 10 MG/ML IJ SOLN
INTRAMUSCULAR | Status: AC
  Filled 2021-04-21: qty 1

## 2021-04-21 MED ORDER — ONDANSETRON HCL 4 MG/2ML IJ SOLN: 4.0000 mg | Freq: Three times a day (TID) | INTRAMUSCULAR | Status: DC | PRN

## 2021-04-21 MED ORDER — DEXAMETHASONE SODIUM PHOSPHATE 4 MG/ML IJ SOLN
INTRAMUSCULAR | Status: DC | PRN
  Administered 2021-04-21: 10 mg via INTRAVENOUS

## 2021-04-21 MED ORDER — FENTANYL-BUPIVACAINE-NACL 0.5-0.125-0.9 MG/250ML-% EP SOLN
EPIDURAL | Status: AC
  Filled 2021-04-21: qty 250

## 2021-04-21 MED ORDER — OXYTOCIN-SODIUM CHLORIDE 30-0.9 UT/500ML-% IV SOLN
1.0000 m[IU]/min | INTRAVENOUS | Status: DC
  Administered 2021-04-21: 2 m[IU]/min via INTRAVENOUS
  Filled 2021-04-21: qty 500

## 2021-04-21 MED ORDER — LACTATED RINGERS IV SOLN: INTRAVENOUS | Status: DC

## 2021-04-21 MED ORDER — MEPERIDINE HCL 25 MG/ML IJ SOLN: 6.2500 mg | INTRAMUSCULAR | Status: DC | PRN

## 2021-04-21 MED ORDER — NALBUPHINE HCL 10 MG/ML IJ SOLN: 5.0000 mg | Freq: Once | INTRAMUSCULAR | Status: DC | PRN

## 2021-04-21 MED ORDER — LACTATED RINGERS AMNIOINFUSION
INTRAVENOUS | Status: DC
  Filled 2021-04-21 (×3): qty 1000

## 2021-04-21 MED ORDER — MISOPROSTOL 25 MCG QUARTER TABLET: 25.0000 ug | ORAL_TABLET | ORAL | Status: DC | PRN

## 2021-04-21 MED ORDER — OXYCODONE HCL 5 MG PO TABS
5.0000 mg | ORAL_TABLET | ORAL | Status: DC | PRN
  Administered 2021-04-23: 5 mg via ORAL
  Filled 2021-04-21: qty 1

## 2021-04-21 MED ORDER — MORPHINE SULFATE (PF) 0.5 MG/ML IJ SOLN
INTRAMUSCULAR | Status: DC | PRN
  Administered 2021-04-21: 4 mg via EPIDURAL

## 2021-04-21 MED ORDER — DIBUCAINE (PERIANAL) 1 % EX OINT: 1.0000 "application " | TOPICAL_OINTMENT | CUTANEOUS | Status: DC | PRN

## 2021-04-21 MED ORDER — LIDOCAINE HCL 2 % IJ SOLN
INTRAMUSCULAR | Status: AC
  Filled 2021-04-21: qty 20

## 2021-04-21 MED ORDER — BUPIVACAINE HCL (PF) 0.5 % IJ SOLN: 5.0000 mL | Freq: Once | INTRAMUSCULAR | Status: DC

## 2021-04-21 MED ORDER — OXYTOCIN 10 UNIT/ML IJ SOLN
INTRAMUSCULAR | Status: AC
  Filled 2021-04-21: qty 2

## 2021-04-21 MED ORDER — LIDOCAINE HCL (PF) 1 % IJ SOLN: 30.0000 mL | INTRAMUSCULAR | Status: DC | PRN

## 2021-04-21 MED ORDER — 0.9 % SODIUM CHLORIDE (POUR BTL) OPTIME
TOPICAL | Status: DC | PRN
  Administered 2021-04-21: 1000 mL

## 2021-04-21 MED ORDER — CEFAZOLIN SODIUM-DEXTROSE 2-4 GM/100ML-% IV SOLN
2.0000 g | INTRAVENOUS | Status: AC
  Administered 2021-04-21: 2 g via INTRAVENOUS
  Filled 2021-04-21: qty 100

## 2021-04-21 MED ORDER — FENTANYL-BUPIVACAINE-NACL 0.5-0.125-0.9 MG/250ML-% EP SOLN
EPIDURAL | Status: DC | PRN
  Administered 2021-04-21: 12 mL/h via EPIDURAL

## 2021-04-21 MED ORDER — SODIUM CHLORIDE 0.9 % IV SOLN
INTRAVENOUS | Status: AC
  Filled 2021-04-21: qty 500

## 2021-04-21 MED ORDER — NALOXONE HCL 4 MG/10ML IJ SOLN
1.0000 ug/kg/h | INTRAVENOUS | Status: DC | PRN
  Filled 2021-04-21: qty 5

## 2021-04-21 MED ORDER — PHENYLEPHRINE 40 MCG/ML (10ML) SYRINGE FOR IV PUSH (FOR BLOOD PRESSURE SUPPORT)
PREFILLED_SYRINGE | INTRAVENOUS | Status: DC | PRN
  Administered 2021-04-21: 100 ug via INTRAVENOUS

## 2021-04-21 MED ORDER — FERROUS SULFATE 325 (65 FE) MG PO TABS
325.0000 mg | ORAL_TABLET | Freq: Two times a day (BID) | ORAL | Status: DC
  Administered 2021-04-22 – 2021-04-23 (×3): 325 mg via ORAL
  Filled 2021-04-21 (×3): qty 1

## 2021-04-21 MED ORDER — NALBUPHINE HCL 10 MG/ML IJ SOLN
5.0000 mg | INTRAMUSCULAR | Status: DC | PRN
  Administered 2021-04-22: 5 mg via INTRAVENOUS
  Filled 2021-04-21: qty 1

## 2021-04-21 MED ORDER — OXYTOCIN-SODIUM CHLORIDE 30-0.9 UT/500ML-% IV SOLN
2.5000 [IU]/h | INTRAVENOUS | Status: AC
  Administered 2021-04-21: 2.5 [IU]/h via INTRAVENOUS
  Filled 2021-04-21 (×2): qty 500

## 2021-04-21 MED ORDER — ONDANSETRON HCL 4 MG/2ML IJ SOLN
INTRAMUSCULAR | Status: AC
  Filled 2021-04-21: qty 2

## 2021-04-21 MED ORDER — OXYCODONE HCL 5 MG PO TABS: 10.0000 mg | ORAL_TABLET | ORAL | Status: DC | PRN

## 2021-04-21 MED ORDER — SOD CITRATE-CITRIC ACID 500-334 MG/5ML PO SOLN: 30.0000 mL | ORAL | Status: DC | PRN

## 2021-04-21 MED ORDER — IBUPROFEN 600 MG PO TABS
600.0000 mg | ORAL_TABLET | Freq: Four times a day (QID) | ORAL | Status: DC
  Administered 2021-04-22 – 2021-04-23 (×3): 600 mg via ORAL
  Filled 2021-04-21 (×3): qty 1

## 2021-04-21 MED ORDER — OXYCODONE-ACETAMINOPHEN 5-325 MG PO TABS: 1.0000 | ORAL_TABLET | ORAL | Status: DC | PRN

## 2021-04-21 MED ORDER — SODIUM CHLORIDE 0.9 % IV SOLN
INTRAVENOUS | Status: DC | PRN
  Administered 2021-04-21: 10 mL via EPIDURAL

## 2021-04-21 MED ORDER — AMMONIA AROMATIC IN INHA
RESPIRATORY_TRACT | Status: AC
  Filled 2021-04-21: qty 10

## 2021-04-21 MED ORDER — ACETAMINOPHEN 500 MG PO TABS
1000.0000 mg | ORAL_TABLET | Freq: Four times a day (QID) | ORAL | Status: AC
  Administered 2021-04-21 – 2021-04-22 (×4): 1000 mg via ORAL
  Filled 2021-04-21 (×4): qty 2

## 2021-04-21 MED ORDER — LIDOCAINE HCL (PF) 1 % IJ SOLN
INTRAMUSCULAR | Status: DC | PRN
  Administered 2021-04-21: 3 mL via SUBCUTANEOUS
  Administered 2021-04-21: 2 mL via SUBCUTANEOUS

## 2021-04-21 MED ORDER — MENTHOL 3 MG MT LOZG
1.0000 | LOZENGE | OROMUCOSAL | Status: DC | PRN
  Filled 2021-04-21: qty 9

## 2021-04-21 MED ORDER — MISOPROSTOL 200 MCG PO TABS
ORAL_TABLET | ORAL | Status: AC
  Filled 2021-04-21: qty 4

## 2021-04-21 MED ORDER — PHENYLEPHRINE 40 MCG/ML (10ML) SYRINGE FOR IV PUSH (FOR BLOOD PRESSURE SUPPORT): 80.0000 ug | PREFILLED_SYRINGE | INTRAVENOUS | Status: DC | PRN

## 2021-04-21 MED ORDER — BUPIVACAINE HCL (PF) 0.5 % IJ SOLN
INTRAMUSCULAR | Status: DC | PRN
  Administered 2021-04-21: 30 mL

## 2021-04-21 MED ORDER — NALBUPHINE HCL 10 MG/ML IJ SOLN
5.0000 mg | Freq: Once | INTRAMUSCULAR | Status: DC | PRN
Start: 2021-04-21 — End: 2021-04-23

## 2021-04-21 MED ORDER — CEFAZOLIN SODIUM-DEXTROSE 1-4 GM/50ML-% IV SOLN
INTRAVENOUS | Status: AC
  Filled 2021-04-21: qty 50

## 2021-04-21 MED ORDER — FENTANYL CITRATE (PF) 100 MCG/2ML IJ SOLN
INTRAMUSCULAR | Status: DC | PRN
  Administered 2021-04-21: 100 ug via EPIDURAL

## 2021-04-21 MED ORDER — SODIUM CHLORIDE 0.9 % IV SOLN
INTRAVENOUS | Status: DC | PRN
  Administered 2021-04-21: 30 ug/min via INTRAVENOUS

## 2021-04-21 MED ORDER — ONDANSETRON HCL 4 MG/2ML IJ SOLN
INTRAMUSCULAR | Status: DC | PRN
  Administered 2021-04-21: 4 mg via INTRAVENOUS

## 2021-04-21 MED ORDER — LIDOCAINE-EPINEPHRINE (PF) 1.5 %-1:200000 IJ SOLN
INTRAMUSCULAR | Status: DC | PRN
Start: 2021-04-21 — End: 2021-04-21
  Administered 2021-04-21: 3 mL via EPIDURAL

## 2021-04-21 MED ORDER — BUTORPHANOL TARTRATE 1 MG/ML IJ SOLN
1.0000 mg | INTRAMUSCULAR | Status: DC | PRN
  Administered 2021-04-21: 1 mg via INTRAVENOUS
  Filled 2021-04-21: qty 1

## 2021-04-21 MED ORDER — SIMETHICONE 80 MG PO CHEW
80.0000 mg | CHEWABLE_TABLET | Freq: Three times a day (TID) | ORAL | Status: DC
  Administered 2021-04-22 – 2021-04-23 (×4): 80 mg via ORAL
  Filled 2021-04-21 (×4): qty 1

## 2021-04-21 MED ORDER — SOD CITRATE-CITRIC ACID 500-334 MG/5ML PO SOLN
ORAL | Status: AC
  Filled 2021-04-21: qty 15

## 2021-04-21 MED ORDER — FENTANYL-BUPIVACAINE-NACL 0.5-0.125-0.9 MG/250ML-% EP SOLN: 12.0000 mL/h | EPIDURAL | Status: DC | PRN

## 2021-04-21 MED ORDER — LIDOCAINE HCL (PF) 1 % IJ SOLN
INTRAMUSCULAR | Status: AC
  Filled 2021-04-21: qty 30

## 2021-04-21 MED ORDER — OXYCODONE-ACETAMINOPHEN 5-325 MG PO TABS: 2.0000 | ORAL_TABLET | ORAL | Status: DC | PRN

## 2021-04-21 MED ORDER — DIPHENHYDRAMINE HCL 50 MG/ML IJ SOLN: 12.5000 mg | INTRAMUSCULAR | Status: DC | PRN

## 2021-04-21 MED ORDER — NALOXONE HCL 0.4 MG/ML IJ SOLN: 0.4000 mg | INTRAMUSCULAR | Status: DC | PRN

## 2021-04-21 MED ORDER — KETOROLAC TROMETHAMINE 30 MG/ML IJ SOLN
30.0000 mg | Freq: Four times a day (QID) | INTRAMUSCULAR | Status: AC
  Filled 2021-04-21 (×2): qty 1

## 2021-04-21 MED ORDER — LACTATED RINGERS IV SOLN
500.0000 mL | INTRAVENOUS | Status: DC | PRN
  Administered 2021-04-21 (×2): 1000 mL via INTRAVENOUS

## 2021-04-21 MED ORDER — LACTATED RINGERS IV SOLN
500.0000 mL | Freq: Once | INTRAVENOUS | Status: AC
  Administered 2021-04-21: 500 mL via INTRAVENOUS

## 2021-04-21 MED ORDER — KETOROLAC TROMETHAMINE 30 MG/ML IJ SOLN
30.0000 mg | Freq: Four times a day (QID) | INTRAMUSCULAR | Status: AC
  Administered 2021-04-21 – 2021-04-22 (×4): 30 mg via INTRAVENOUS
  Filled 2021-04-21 (×3): qty 1

## 2021-04-21 MED ORDER — DIPHENHYDRAMINE HCL 25 MG PO CAPS
25.0000 mg | ORAL_CAPSULE | Freq: Four times a day (QID) | ORAL | Status: DC | PRN
  Administered 2021-04-22: 25 mg via ORAL
  Filled 2021-04-21: qty 1

## 2021-04-21 MED ORDER — NALBUPHINE HCL 10 MG/ML IJ SOLN
5.0000 mg | INTRAMUSCULAR | Status: DC | PRN
Start: 2021-04-21 — End: 2021-04-23

## 2021-04-21 MED ORDER — COCONUT OIL OIL
1.0000 "application " | TOPICAL_OIL | Status: DC | PRN
  Filled 2021-04-21 (×3): qty 120

## 2021-04-21 MED ORDER — ACETAMINOPHEN 325 MG PO TABS: 650.0000 mg | ORAL_TABLET | ORAL | Status: DC | PRN

## 2021-04-21 MED ORDER — OXYTOCIN BOLUS FROM INFUSION: 333.0000 mL | Freq: Once | INTRAVENOUS | Status: DC

## 2021-04-21 MED ORDER — LIDOCAINE HCL (PF) 2 % IJ SOLN
INTRAMUSCULAR | Status: DC | PRN
  Administered 2021-04-21 (×2): 100 mg via EPIDURAL
  Administered 2021-04-21: 60 mg via EPIDURAL

## 2021-04-21 MED ORDER — WITCH HAZEL-GLYCERIN EX PADS: 1.0000 "application " | MEDICATED_PAD | CUTANEOUS | Status: DC | PRN

## 2021-04-21 MED ORDER — SENNOSIDES-DOCUSATE SODIUM 8.6-50 MG PO TABS
2.0000 | ORAL_TABLET | ORAL | Status: DC
  Administered 2021-04-21 – 2021-04-22 (×2): 2 via ORAL
  Filled 2021-04-21 (×2): qty 2

## 2021-04-21 MED ORDER — SOD CITRATE-CITRIC ACID 500-334 MG/5ML PO SOLN
30.0000 mL | ORAL | Status: AC
  Administered 2021-04-21: 30 mL via ORAL

## 2021-04-21 MED ORDER — ONDANSETRON HCL 4 MG/2ML IJ SOLN
4.0000 mg | Freq: Four times a day (QID) | INTRAMUSCULAR | Status: DC | PRN
  Administered 2021-04-21: 4 mg via INTRAVENOUS
  Filled 2021-04-21: qty 2

## 2021-04-21 MED ORDER — TERBUTALINE SULFATE 1 MG/ML IJ SOLN
0.2500 mg | Freq: Once | INTRAMUSCULAR | Status: DC | PRN
  Filled 2021-04-21: qty 1

## 2021-04-21 MED ORDER — MORPHINE SULFATE (PF) 0.5 MG/ML IJ SOLN
INTRAMUSCULAR | Status: AC
  Filled 2021-04-21: qty 10

## 2021-04-21 MED ORDER — PHENYLEPHRINE HCL (PRESSORS) 10 MG/ML IV SOLN
INTRAVENOUS | Status: AC
  Filled 2021-04-21: qty 1

## 2021-04-21 MED ORDER — BUPIVACAINE HCL (PF) 0.5 % IJ SOLN
INTRAMUSCULAR | Status: AC
  Filled 2021-04-21: qty 30

## 2021-04-21 MED ORDER — SODIUM CHLORIDE 0.9% FLUSH: 3.0000 mL | INTRAVENOUS | Status: DC | PRN

## 2021-04-21 SURGICAL SUPPLY — 34 items
CATH KIT ON-Q SILVERSOAK 5IN (CATHETERS) ×4 IMPLANT
DERMABOND ADVANCED (GAUZE/BANDAGES/DRESSINGS) ×2
DERMABOND ADVANCED .7 DNX12 (GAUZE/BANDAGES/DRESSINGS) ×2 IMPLANT
DRSG OPSITE POSTOP 4X10 (GAUZE/BANDAGES/DRESSINGS) ×2 IMPLANT
DRSG TEGADERM 4X10 (GAUZE/BANDAGES/DRESSINGS) ×2 IMPLANT
DRSG TELFA 3X8 NADH (GAUZE/BANDAGES/DRESSINGS) ×2 IMPLANT
ELECT CAUTERY BLADE 6.4 (BLADE) ×2 IMPLANT
ELECT REM PT RETURN 9FT ADLT (ELECTROSURGICAL) ×2
ELECTRODE REM PT RTRN 9FT ADLT (ELECTROSURGICAL) ×1 IMPLANT
GAUZE SPONGE 4X4 12PLY STRL (GAUZE/BANDAGES/DRESSINGS) ×2 IMPLANT
GLOVE SURG ENC MOIS LTX SZ7 (GLOVE) ×2 IMPLANT
GLOVE SURG UNDER LTX SZ7.5 (GLOVE) ×2 IMPLANT
GOWN STRL REUS W/ TWL LRG LVL3 (GOWN DISPOSABLE) ×3 IMPLANT
GOWN STRL REUS W/TWL LRG LVL3 (GOWN DISPOSABLE) ×3
MANIFOLD NEPTUNE II (INSTRUMENTS) ×2 IMPLANT
MAT PREVALON FULL STRYKER (MISCELLANEOUS) ×2 IMPLANT
NS IRRIG 1000ML POUR BTL (IV SOLUTION) ×2 IMPLANT
PACK C SECTION AR (MISCELLANEOUS) ×2 IMPLANT
PAD OB MATERNITY 4.3X12.25 (PERSONAL CARE ITEMS) ×4 IMPLANT
PAD PREP 24X41 OB/GYN DISP (PERSONAL CARE ITEMS) ×2 IMPLANT
PENCIL SMOKE EVACUATOR (MISCELLANEOUS) ×2 IMPLANT
SCRUB EXIDINE 4% CHG 4OZ (MISCELLANEOUS) ×2 IMPLANT
STRIP CLOSURE SKIN 1/2X4 (GAUZE/BANDAGES/DRESSINGS) ×2 IMPLANT
SUT MNCRL 4-0 (SUTURE) ×1
SUT MNCRL 4-0 27XMFL (SUTURE) ×1
SUT PDS AB 1 TP1 96 (SUTURE) ×2 IMPLANT
SUT PLAIN GUT 0 (SUTURE) IMPLANT
SUT VIC AB 0 CTX 36 (SUTURE) ×2
SUT VIC AB 0 CTX36XBRD ANBCTRL (SUTURE) ×2 IMPLANT
SUT VICRYL 3-0 (SUTURE) ×2 IMPLANT
SUTURE MNCRL 4-0 27XMF (SUTURE) ×1 IMPLANT
SWABSTK COMLB BENZOIN TINCTURE (MISCELLANEOUS) ×2 IMPLANT
TAPE STRIPS DRAPE STRL (GAUZE/BANDAGES/DRESSINGS) ×8 IMPLANT
WATER STERILE IRR 500ML POUR (IV SOLUTION) ×2 IMPLANT

## 2021-04-21 NOTE — Anesthesia Preprocedure Evaluation (Signed)
Anesthesia Evaluation  Patient identified by MRN, date of birth, ID band Patient awake    Reviewed: Allergy & Precautions, NPO status , Patient's Chart, lab work & pertinent test results  History of Anesthesia Complications Negative for: history of anesthetic complications  Airway Mallampati: II  TM Distance: >3 FB Neck ROM: Full    Dental no notable dental hx. (+) Teeth Intact   Pulmonary neg pulmonary ROS, neg sleep apnea, neg COPD, Patient abstained from smoking.Not current smoker,    Pulmonary exam normal breath sounds clear to auscultation       Cardiovascular Exercise Tolerance: Good METS(-) hypertension(-) CAD and (-) Past MI negative cardio ROS  (-) dysrhythmias  Rhythm:Regular Rate:Normal - Systolic murmurs    Neuro/Psych negative neurological ROS  negative psych ROS   GI/Hepatic neg GERD  ,(+)     (-) substance abuse  ,   Endo/Other  neg diabetes  Renal/GU negative Renal ROS     Musculoskeletal   Abdominal   Peds  Hematology   Anesthesia Other Findings Past Medical History: No date: Medical history non-contributory No date: No known health problems  Reproductive/Obstetrics (+) Pregnancy                             Anesthesia Physical Anesthesia Plan  ASA: 2  Anesthesia Plan: Epidural   Post-op Pain Management:    Induction:   PONV Risk Score and Plan: 2 and Treatment may vary due to age or medical condition and Ondansetron  Airway Management Planned: Natural Airway  Additional Equipment:   Intra-op Plan:   Post-operative Plan:   Informed Consent: I have reviewed the patients History and Physical, chart, labs and discussed the procedure including the risks, benefits and alternatives for the proposed anesthesia with the patient or authorized representative who has indicated his/her understanding and acceptance.       Plan Discussed with:  Surgeon  Anesthesia Plan Comments: (Discussed R/B/A of neuraxial anesthesia technique with patient: - rare risks of spinal/epidural hematoma, nerve damage, infection - Risk of PDPH - Risk of itching - Risk of nausea and vomiting - Risk of poor block necessitating replacement of epidural. - Risk of allergic reactions. Patient voiced understanding.)        Anesthesia Quick Evaluation

## 2021-04-21 NOTE — Transfer of Care (Signed)
Immediate Anesthesia Transfer of Care Note  Patient: Faith Gonzales  Procedure(s) Performed: CESAREAN SECTION  Patient Location: PACU and Mother/Baby  Anesthesia Type:Epidural  Level of Consciousness: awake, alert  and oriented  Airway & Oxygen Therapy: Patient Spontanous Breathing  Post-op Assessment: Report given to RN and Post -op Vital signs reviewed and stable  Post vital signs: Reviewed and stable  Last Vitals:  Vitals Value Taken Time  BP    Temp    Pulse    Resp    SpO2      Last Pain:  Vitals:   04/21/21 1905  TempSrc: Oral  PainSc: 0-No pain         Complications: No notable events documented.

## 2021-04-21 NOTE — Progress Notes (Signed)
Labor Check  Subj:  Complaints: comfortable with epidural. I have reviewed her clinical course to this point.  She came in to L&D in spontaneous labor and has been augmented.  She had SROM with thick meconium noted. Since that time she initially had variable decelerations and now has had a run of late decelerations.  She recently had her pitocin restarted and at 1 milli-units/min she started having late decelerations.  Her pitocin has now been stopped.    Obj:  BP (!) 127/93   Pulse 87   Temp 98 F (36.7 C) (Oral)   Resp 16   Ht 5\' 3"  (1.6 m)   Wt 86.6 kg Comment: 191lbs  LMP 07/09/2020   SpO2 90%   BMI 33.83 kg/m  Dose (milli-units/min) Oxytocin: 0 milli-units/min  Cervix: Dilation: 4.5 / Effacement (%): 80 / Station: -2, -3  Baseline FHR: 135 beats/min   Variability: moderate   Accelerations: present   Decelerations: present (recurrent late decelerations as low as the 70s-80s, but typically down to about 90s-100s).   Contractions: present frequency: 2 q 10 min Overall assessment: cat 2  Female chaperone present for pelvic exam:   A/P: 25 y.o. G67P0020 female at [redacted]w[redacted]d with active labor.  1.  Labor: she is remote from delivery.  She has had recurrent late decelerations and now has the pitocin discontinued.  She has had IV fluid bolus, maternal position changes. Her fetus does respond well to scalp stimulation per the CNM.  Her pitocin has been discontinued and she continues to contract every 5-6  minutes.    2.  FWB: guarded overall, Overall assessment: category 2.  Above measures taken.  I discussed with her that I am concerned about her fetus's lack of tolerance to the process being distant from delivery with her first baby. I will try the amnioinfusion that was just started and might consider restarting pitocin, if she responds well to the amnioinfusion.  However, if there is no tolerance of contractions, will have to move to abdominal delivery.   3.  GBS negative on 03/21/21  4.   Pain: well-controlled on epidural 5.  Recheck: as needed.     03/23/21, MD, [facility], [facility] 04/21/2021 6:50 PM

## 2021-04-21 NOTE — Anesthesia Procedure Notes (Signed)
Epidural Patient location during procedure: OB Start time: 04/21/2021 4:18 PM End time: 04/21/2021 4:46 PM  Staffing Anesthesiologist: Corinda Gubler, MD Performed: anesthesiologist   Preanesthetic Checklist Completed: patient identified, IV checked, site marked, risks and benefits discussed, surgical consent, monitors and equipment checked, pre-op evaluation and timeout performed  Epidural Patient position: sitting Prep: ChloraPrep Patient monitoring: heart rate, continuous pulse ox and blood pressure Approach: midline Location: L3-L4 Injection technique: LOR saline  Needle:  Needle type: Tuohy  Needle gauge: 17 G Needle length: 9 cm and 9 Needle insertion depth: 6.5 cm Catheter type: closed end flexible Catheter size: 19 Gauge Catheter at skin depth: 11 cm Test dose: negative and 1.5% lidocaine with Epi 1:200 K  Assessment Sensory level: T10 Events: blood not aspirated, injection not painful, no injection resistance, no paresthesia and negative IV test  Additional Notes First  attempt - somewhat challenging epidural, some slight scoliosis. Pt. Evaluated and documentation done after procedure finished. Patient identified. Risks/Benefits/Options discussed with patient including but not limited to bleeding, infection, nerve damage, paralysis, failed block, incomplete pain control, headache, blood pressure changes, nausea, vomiting, reactions to medication both or allergic, itching and postpartum back pain. Confirmed with bedside nurse the patient's most recent platelet count. Confirmed with patient that they are not currently taking any anticoagulation, have any bleeding history or any family history of bleeding disorders. Patient expressed understanding and wished to proceed. All questions were answered. Sterile technique was used throughout the entire procedure. Please see nursing notes for vital signs. Test dose was given through epidural catheter and negative prior to continuing to  dose epidural or start infusion. Warning signs of high block given to the patient including shortness of breath, tingling/numbness in hands, complete motor block, or any concerning symptoms with instructions to call for help. Patient was given instructions on fall risk and not to get out of bed. All questions and concerns addressed with instructions to call with any issues or inadequate analgesia.     Patient tolerated the insertion well without immediate complications.  Reason for block: procedure for painReason for block:procedure for pain

## 2021-04-21 NOTE — Op Note (Signed)
Cesarean Section Operative Note    Patient Name: Faith Gonzales  MRN: [id number]  Date of Surgery: 04/21/2021   Pre-operative Diagnosis:  1) Fetal Intolerance of labor 2) intrauterine pregnancy at [redacted]w[redacted]d   Post-operative Diagnosis:  1) Fetal Intolerance of labor 2) intrauterine pregnancy at [redacted]w[redacted]d    Procedure: Primary Low Transverse Cesarean Section via Pfannenstiel incision with double layer uterine closure  Surgeon: Surgeon(s) and Role:    Conard Novak, MD - Primary   Assistants:  Paula Compton, CNM; No other capable assistant available, in surgery requiring high level assistant.  Anesthesia: epidural   Findings:  1) normal appearing gravid uterus, fallopian tubes, and ovaries 2) viable female infant with weight of 3,010 grams (6 lb 10 oz), AGARs 8 and 9   Quantified Blood Loss: 430 mL  Total IV Fluids: 600 ml   Urine Output: 100 mL of clear urine at end of procedure  Specimens: none  Complications: no complications  Disposition: PACU - hemodynamically stable.   Maternal Condition: stable   Baby condition / location:  Couplet care / Skin to Skin  Procedure Details:  The patient was seen in the Holding Room. The risks, benefits, complications, treatment options, and expected outcomes were discussed with the patient. The patient concurred with the proposed plan, giving informed consent. identified as Faith Gonzales and the procedure verified as C-Section Delivery. A Time Out was held and the above information confirmed.   After induction of anesthesia, the patient was draped and prepped in the usual sterile manner. A Pfannenstiel incision was made and carried down through the subcutaneous tissue to the fascia. Fascial incision was made and extended transversely. The fascia was separated from the underlying rectus tissue superiorly and inferiorly. The peritoneum was identified and entered. Peritoneal incision was extended longitudinally. The bladder flap was not bluntly  or sharply freed from the lower uterine segment. A low transverse uterine incision was made and the hysterotomy was extended with cranial-caudal tension. Delivered from cephalic presentation was a 3,010 gram Living newborn infant(s) or Female with Apgar scores of 8 at one minute and 9 at five minutes. Cord ph was not sent the umbilical cord was clamped and cut cord blood was not obtained for evaluation. The placenta was removed Intact and appeared normal. The uterine outline, tubes and ovaries appeared normal. The uterine incision was closed with running locked sutures of 0 Vicryl.  A second layer of the same suture was thrown in an imbricating fashion.  Hemostasis was assured.  The uterus was returned to the abdomen and the paracolic gutters were cleared of all clots and debris.  The rectus muscles were inspected and found to be hemostatic.  The On-Q catheter pumps were inserted in accordance with the manufacturer's recommendations.  The catheters were inserted approximately 4cm cephelad to the incision line, approximately 1cm apart, straddling the midline.  They were inserted to a depth of the 4th mark. They were positioned superficial to the rectus abdominus muscles and deep to the rectus fascia.    The fascia was then reapproximated with running sutures of 1-0 PDS, looped. A 3-0 Vicryl suture was used in a running fashion to re-approximate the subcutaneous tissue to decrease tension on the skin closure.  The subcuticular closure was performed using 4-0 monocryl. The skin closure was reinforced using surgical skin glue.  The On-Q catheters were bolused with 5 mL of 0.5% marcaine plain for a total of 10 mL.  The catheters were affixed to the skin  with surgical skin glue, steri-strips, and tegaderm.    The surgical assistant performed tissue retraction, assistance with suturing, and fundal pressure.    Instrument, sponge, and needle counts were correct prior the abdominal closure and were correct at the  conclusion of the case.  The patient received Ancef 2 gram IV and Azithromycin 500 mg IV prior to skin incision (within 30 minutes). For VTE prophylaxis she was wearing SCDs throughout the case.  The assistant surgeon was a CNM due to lack of availability of another Sales promotion account executive.   Signed: Conard Novak, MD 04/21/2021 8:54 PM

## 2021-04-21 NOTE — Discharge Summary (Signed)
Postpartum Discharge Summary   Patient Name: Faith Gonzales DOB: Oct 01, 1995 MRN: [id number]  Date of admission: 04/21/2021 Delivery date:04/21/2021  Delivering provider: Prentice Docker D  Date of discharge: 04/23/2021  Admitting diagnosis: Encounter for induction of labor [Z34.90] Intrauterine pregnancy: [redacted]w[redacted]d    Secondary diagnosis:  Principal Problem:   Supervision of other normal pregnancy, antepartum Active Problems:   Encounter for induction of labor   [redacted] weeks gestation of pregnancy   Postpartum care following cesarean delivery   Encounter for care or examination of lactating mother  Additional problems: none    Discharge diagnosis: Term Pregnancy Delivered                                              Post partum procedures: none Augmentation: Pitocin Complications: None  Hospital course: Onset of Labor With Unplanned C/S   25y.o. yo G3P0020 at 451w6das admitted in [facility] 04/21/2021. Patient had a labor course significant for SROM with thick meconium and recurrent late decelerations and inability to give pitocin without late decelerations. The patient went for cesarean section due to  fetal intolerance of labor . Delivery details as follows: Membrane Rupture Time/Date: 3:55 PM ,04/21/2021   Delivery Method:C-Section, Low Transverse  Details of operation can be found in separate operative note. Patient had an uncomplicated postpartum course.  She is ambulating,tolerating a regular diet, passing flatus, and urinating well.  Patient is discharged home in stable condition 04/23/21.  Newborn Data: Birth date:04/21/2021  Birth time:8:10 PM  Gender:Female  "[place]" Living status:Living  Apgars:8 ,9  Weight:3010 g   Magnesium Sulfate received: No BMZ received: No Rhophylac:N/A MMR:No T-DaP:Given prenatally on 02/14/2021 Transfusion:No  Physical exam  Vitals:   04/22/21 1210 04/22/21 1543 04/22/21 2350 04/23/21 0826  BP: (!) 110/50 106/66 109/64 114/71  Pulse: 60  60 65 60  Resp: _0 Temp: 98.8 F (37.1 C) 98.5 F (36.9 C) 98.1 F (36.7 C) 98.1 F (36.7 C)  TempSrc: Oral Oral Oral Oral  SpO2:   97% 100%  Weight:      Height:       General: alert, cooperative, and no distress Lochia: appropriate Uterine Fundus: firm Incision: Healing well with no significant drainage, On Q intact DVT Evaluation: No evidence of DVT seen on physical exam. Labs: Lab Results  Component Value Date   WBC 16.4 (H) 04/22/2021   HGB 12.2 04/22/2021   HCT 34.7 (L) 04/22/2021   MCV 101.5 (H) 04/22/2021   PLT 130 (L) 04/22/2021   No flowsheet data found. Edinburgh Score: Edinburgh Postnatal Depression Scale Screening Tool 04/22/2021  I have been able to laugh and see the funny side of things. 0  I have looked forward with enjoyment to things. 0  I have blamed myself unnecessarily when things went wrong. 2  I have been anxious or worried for no good reason. 2  I have felt scared or panicky for no good reason. 2  Things have been getting on top of me. 1  I have been so unhappy that I have had difficulty sleeping. 1  I have felt sad or miserable. 1  I have been so unhappy that I have been crying. 1  The thought of harming myself has occurred to me. 0  Edinburgh Postnatal Depression Scale Total 10      After visit  meds:  Allergies as of 04/23/2021   No Known Allergies      Medication List     STOP taking these medications    valACYclovir 500 MG tablet Commonly known as: VALTREX       TAKE these medications    multivitamin-prenatal 27-0.8 MG Tabs tablet Take 1 tablet by mouth daily at 12 noon.   oxyCODONE 5 MG immediate release tablet Commonly known as: Oxy IR/ROXICODONE Take 1 tablet (5 mg total) by mouth every 6 (six) hours as needed for up to 5 days for severe pain.               Discharge Care Instructions  (From admission, onward)           Start     Ordered   04/23/21 0000  Discharge wound care:       Comments:  Keep incision dry, clean.   04/23/21 1005             Discharge home in stable condition Infant Feeding: Breast Infant Disposition:home with mother Discharge instruction: per After Visit Summary and Postpartum booklet. Activity: Advance as tolerated. Pelvic rest for 6 weeks.  Diet: routine diet Anticipated Birth Control:  non-hormonal Postpartum Appointment:1 week Additional Postpartum F/U: Incision check 1 week Future Appointments:No future appointments.  Follow up Visit:  Follow-up Information     Will Bonnet, MD. Schedule an appointment as soon as possible for a visit in 1 week(s).   Specialty: Obstetrics and Gynecology Why: For incision check Contact information: [address] [place] [address] [phone]                 SIGNED: Christean Leaf, CNM [facility] 04/23/2021, 10:07 AM

## 2021-04-21 NOTE — Progress Notes (Signed)
Faith Gonzales is a 25 y.o. G3P0020 at [redacted]w[redacted]d by ultrasound admitted for labor at term.  Subjective: She continues to feel contractions, although she is able to tak through them.she would like some pain medication. She denies any LOF.   Objective: BP 113/69   Pulse 71   Temp 97.6 F (36.4 C) (Oral)   Resp 16   Ht 5\' 3"  (1.6 m)   Wt 86.6 kg Comment: 191lbs  LMP 07/09/2020   BMI 33.83 kg/m  No intake/output data recorded. No intake/output data recorded.  FHT:  FHR: 135 baseline bpm, variability: moderate,  accelerations:  Present,  decelerations:  Absent UC:   regular, every 9 minutes SVE:   Dilation: 3.5 Effacement (%): 90 Station: -3 Exam by:: [email or web address] CNM  Labs: Lab Results  Component Value Date   WBC 8.5 04/21/2021   HGB 14.0 04/21/2021   HCT 38.8 04/21/2021   MCV 97.7 04/21/2021   PLT 160 04/21/2021    Assessment / Plan: Spontaneous labor, progressing normally, with contractions less frequent  Labor: Progressing normally  Fetal Wellbeing:  Category I Pain Control:  Labor support without medications, but now asking for IV pain meds. I/D:  n/a Anticipated MOD:  NSVD  Will commence augmentation with pitocin. Options for pain control in labor described to patient. Also discussed augmentation of labor with pitocin, with likely AROM once more dilated.  04/23/2021 04/21/2021, 3:00 PM

## 2021-04-21 NOTE — Progress Notes (Signed)
Patient with continued recurrent late decelerations with each contraction and no augmentation. She has not made cervical change. Given my inability to safely affect vaginal delivery with augmentation due to repetitieve late decelerations, decision made to proceed with cesarean delivery. Discussed all this with the patient. She voiced understanding and agreement.   Will move forward with urgent delivery. Current fetal tracing is category 2 due to the decelerations. The fetus does have moderate variability with occasional accelerations.   Nursing and anesthesia aware.     Thomasene Mohair, MD, [facility], [facility] 04/21/2021 7:22 PM

## 2021-04-21 NOTE — OB Triage Note (Signed)
Patient to OBS 3 with complaints of contractions that began around 0350am.  She reports that they have been occurring every 5-6 minutes.  She states that her discharge has been rusty brown colored.  She rates contraction pain as 8/10.

## 2021-04-21 NOTE — H&P (Signed)
History and Physical Interval Note:  04/21/2021 4:22 PM  Faith Gonzales  had been scheduled  for INDUCTION OF LABOR (cervical ripening agents) on this coming Monday, August 29th. with the diagnosis of Postdates and Favorable cervix at term. The various methods of treatment have been discussed with the patient and family.   This morning she arrived to labor and Delivery in early labor, and it was decided to admit her and proceed with augmentation of labor.  The patient's history has been reviewed, patient examined, the change in her cervical exam documented and she consents to the Plan of Care. See H&P. I have reviewed the patient's chart and labs.  Questions were answered to the patient's satisfaction.    Mirna Mires, CNM  04/21/2021 6:28 PM   [facility], [facility] 04/21/2021  4:22 PM

## 2021-04-22 ENCOUNTER — Encounter: Payer: Self-pay | Admitting: Obstetrics and Gynecology

## 2021-04-22 LAB — CBC
HCT: 34.7 % — ABNORMAL LOW (ref 36.0–46.0)
Hemoglobin: 12.2 g/dL (ref 12.0–15.0)
MCH: 35.7 pg — ABNORMAL HIGH (ref 26.0–34.0)
MCHC: 35.2 g/dL (ref 30.0–36.0)
MCV: 101.5 fL — ABNORMAL HIGH (ref 80.0–100.0)
Platelets: 130 10*3/uL — ABNORMAL LOW (ref 150–400)
RBC: 3.42 MIL/uL — ABNORMAL LOW (ref 3.87–5.11)
RDW: 13.4 % (ref 11.5–15.5)
WBC: 16.4 10*3/uL — ABNORMAL HIGH (ref 4.0–10.5)
nRBC: 0 % (ref 0.0–0.2)

## 2021-04-22 LAB — RPR: RPR Ser Ql: NONREACTIVE

## 2021-04-22 NOTE — Progress Notes (Signed)
Subjective: Postpartum Day 1: Cesarean Delivery Patient reports tolerating PO and + flatus.  She had her foley catheter removed this morning and has not voided yet. She is feeling well otherwise.  Objective: Vital signs in last 24 hours: Temp:  [97.6 F (36.4 C)-99.4 F (37.4 C)] 98.8 F (37.1 C) (08/28 0815) Pulse Rate:  [55-158] 62 (08/28 0815) Resp:  [12-21] 18 (08/28 0815) BP: (100-173)/(46-124) 109/61 (08/28 0815) SpO2:  [86 %-100 %] 97 % (08/28 0815)  Physical Exam:  General: alert, cooperative, no distress, and mildly obese Lochia: appropriate Uterine Fundus: firm Incision: healing well, no significant drainage DVT Evaluation: No evidence of DVT seen on physical exam. Wearing SCD hose  Recent Labs    04/21/21 1307 04/22/21 0535  HGB 14.0 12.2  HCT 38.8 34.7*    Assessment/Plan: Status post Cesarean section. Doing well postoperatively.  Continue current care Ambulation encouraged.Mirna Mires 04/22/2021, 11:41 AM

## 2021-04-22 NOTE — Anesthesia Postprocedure Evaluation (Signed)
Anesthesia Post Note  Patient: Faith Gonzales  Procedure(s) Performed: CESAREAN SECTION  Patient location during evaluation: Mother Baby Anesthesia Type: Epidural Level of consciousness: awake and alert Pain management: pain level controlled Vital Signs Assessment: post-procedure vital signs reviewed and stable Respiratory status: spontaneous breathing, nonlabored ventilation and respiratory function stable Cardiovascular status: stable Postop Assessment: no headache, no backache, patient able to bend at knees and no apparent nausea or vomiting Anesthetic complications: no   No notable events documented.   Last Vitals:  Vitals:   04/22/21 0700 04/22/21 0815  BP:  109/61  Pulse: (!) 59 62  Resp:  18  Temp:  37.1 C  SpO2: 94% 97%    Last Pain:  Vitals:   04/22/21 0815  TempSrc: Oral  PainSc:                  Lenard Simmer

## 2021-04-22 NOTE — Addendum Note (Signed)
Addendum  created 04/22/21 1202 by Lenard Simmer, MD   Intraprocedure Meds edited

## 2021-04-22 NOTE — Anesthesia Post-op Follow-up Note (Signed)
  Anesthesia Pain Follow-up Note  Patient: Faith Gonzales  Day #: 1  Date of Follow-up: 04/22/2021 Time: 11:58 AM  Last Vitals:  Vitals:   04/22/21 0700 04/22/21 0815  BP:  109/61  Pulse: (!) 59 62  Resp:  18  Temp:  37.1 C  SpO2: 94% 97%    Level of Consciousness: alert  Pain: mild   Side Effects:Pruritis  Catheter Site Exam:clean, dry     Plan: D/C from anesthesia care at surgeon's request  Lenard Simmer

## 2021-04-23 ENCOUNTER — Encounter: Payer: Self-pay | Admitting: Obstetrics and Gynecology

## 2021-04-23 MED ORDER — OXYCODONE HCL 5 MG PO TABS: 5.0000 mg | ORAL_TABLET | Freq: Four times a day (QID) | ORAL | 0 refills | Status: AC | PRN

## 2021-04-23 NOTE — Progress Notes (Signed)
Patient discharged with infant. Discharge instructions, prescriptions, and follow up appointments given to and reviewed with patient. Patient verbalized understanding. Patient decided to leave OnQ pump in and will be removed at follow-up appointment. Will be escorted out by axillary.

## 2021-04-23 NOTE — TOC Initial Note (Signed)
Transition of Care [facility]) - Initial/Assessment Note    Patient Details  Name: Faith Gonzales MRN: [id number] Date of Birth: [birth year]  [facility] CM/SW Contact:    Hetty Ely, RN Phone Number: 04/23/2021, 10:48 AM  Clinical Narrative:   Spoke with MOB, first born  named [place]. MOB lives with FOB, who was not in the room during the visit. 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 Denies history of anxiety/ depression, nor prescribed medication. MOB says they have everything needed for newborn and voices no identified needs. Discussed Post Partum depression, signs and strategies to include seeking help when it occurs. MOB voices understanding, says she feel comfortable with asking support system to assist as needed. MOB given resource list to assess services if needed. No barriers identified at this time, can discharge if medically stable.                    Patient Goals and CMS Choice        Expected Discharge Plan and Services           Expected Discharge Date: 04/23/21                                    Prior Living Arrangements/Services                       Activities of Daily Living Home Assistive Devices/Equipment: None ADL Screening (condition at time of admission) Patient's cognitive ability adequate to safely complete daily activities?: Yes Is the patient deaf or have difficulty hearing?: No Does the patient have difficulty seeing, even when wearing glasses/contacts?: No Does the patient have difficulty concentrating, remembering, or making decisions?: No Patient able to express need for assistance with ADLs?: Yes Does the patient have difficulty dressing or bathing?: No Independently performs ADLs?: Yes (appropriate for developmental age) Does the patient have difficulty walking or climbing stairs?: No Weakness of Legs: None Weakness of Arms/Hands: None  Permission Sought/Granted                   Emotional Assessment              Admission diagnosis:  Encounter for induction of labor [Z34.90] Patient Active Problem List   Diagnosis Date Noted   Postpartum care following cesarean delivery 04/23/2021   Encounter for care or examination of lactating mother 04/23/2021   Encounter for induction of labor 04/21/2021   [redacted] weeks gestation of pregnancy 04/21/2021   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 [place] [address] Phone: [phone] Fax: [phone]     Social Determinants of Health (SDOH) Interventions    Readmission Risk Interventions No flowsheet data found.

## 2021-04-23 NOTE — Lactation Note (Signed)
This note was copied from a baby's chart. Lactation Consultation Note  Patient Name: Faith Gonzales OEHOZ'Y Date: 04/23/2021 Reason for consult: Initial assessment;Primapara;Term;Other (Comment) (c-section) Age:25 hours  Initial lactation visit for P1 mom who delivered via c-section 37hrs ago.  Feedings have gone well thus far, some nipple tenderness- coconut oil is being used. 24hr testing and output exceeds minimum expectations, and mom desires discharge home today.  Baby at breast, but mom reporting difficulty with baby staying awake and latched deeply enough. LC observes cradle hold, and attempts with latching by only pulling at nipple to place in front of mouth. LC assisted by supporting baby's head, and sandwiching breast tissue, talking to parents through the step. Baby grasped the breast easily, and has rhythmic sucking pattern but did fall asleep quickly.  LC walked mom through re-latching baby, and she was independent and states that it feels much better.  Encouraged continued on demand feedings, potential for growth spurts and cluster feeding. Parents had questions about starting pumping/bottle feeding, and BF only at night. Discussed with parents the benefits of on demand breastfeeding for first 3-4 weeks as milk supply is established, answered questions how to transition to pumping/bottle feeding after that, and paced-bottle feeding.  Information given for mom with breast fullness/engorgement and management of both, nipple care, and outpatient lactation services. Mom encouraged to call out today for BF support as needed, and after going home.  Maternal Data Has patient been taught Hand Expression?: Yes Does the patient have breastfeeding experience prior to this delivery?: No  Feeding Mother's Current Feeding Choice: Breast Milk  LATCH Score Latch: Grasps breast easily, tongue down, lips flanged, rhythmical sucking.  Audible Swallowing: A few with stimulation  Type of  Nipple: Everted at rest and after stimulation  Comfort (Breast/Nipple): Soft / non-tender  Hold (Positioning): Assistance needed to correctly position infant at breast and maintain latch.  LATCH Score: 8   Lactation Tools Discussed/Used    Interventions Interventions: Breast feeding basics reviewed;Assisted with latch;Hand express;Support pillows;Adjust position;Education  Discharge Discharge Education: Engorgement and breast care;Warning signs for feeding baby;Outpatient recommendation Pump: Personal  Consult Status Consult Status: Complete    Danford Bad 04/23/2021, 9:54 AM

## 2021-04-25 ENCOUNTER — Ambulatory Visit (INDEPENDENT_AMBULATORY_CARE_PROVIDER_SITE_OTHER): Payer: BC Managed Care – PPO | Admitting: Obstetrics and Gynecology

## 2021-04-25 ENCOUNTER — Other Ambulatory Visit: Payer: Self-pay

## 2021-04-25 ENCOUNTER — Encounter: Payer: Self-pay | Admitting: Obstetrics and Gynecology

## 2021-04-25 VITALS — BP 126/84 | Ht 63.0 in

## 2021-04-25 DIAGNOSIS — Z09 Encounter for follow-up examination after completed treatment for conditions other than malignant neoplasm: Secondary | ICD-10-CM

## 2021-04-25 NOTE — Progress Notes (Signed)
   Postoperative Follow-up Patient presents post op from cesarean section  4days  ago.  Subjective: She denies fever, chills, nausea, and vomiting. Eating a regular diet without difficulty. Pain is well controlled.  Activity: increasing slowly. She denies issues with her incision.    Objective: BP 126/84   Ht 5\' 3"  (1.6 m)   BMI 33.83 kg/m   Physical Exam Constitutional:      General: She is not in acute distress.    Appearance: Normal appearance.  HENT:     Head: Normocephalic and atraumatic.  Eyes:     General: No scleral icterus.    Conjunctiva/sclera: Conjunctivae normal.  Abdominal:     General: There is no distension.     Palpations: Abdomen is soft. There is mass (uterine fundus at U-1, firm).     Tenderness: There is no abdominal tenderness. There is no guarding or rebound.     Comments: Incision: without erythema, induration, warmth, and tenderness. It is clean, dry, and intact.  OnQ pump removed intact by me (both catheters). No issues with this site.  Neurological:     General: No focal deficit present.     Mental Status: She is alert and oriented to person, place, and time.     Cranial Nerves: No cranial nerve deficit.  Psychiatric:        Mood and Affect: Mood normal.        Behavior: Behavior normal.        Judgment: Judgment normal.    Assessment: 25 y.o. s/p cesarean section progressing well  Plan: Patient has done well after surgery with no apparent complications.  I have discussed the post-operative course to date, and the expected progress moving forward.  The patient understands what complications to be concerned about.    Activity plan: increase slowly.   Unsure of contraception plan. Thinking non-hormonal.   Return in about 5 weeks (around 05/30/2021) for Six Week Postpartum.  07/30/2021, MD 04/25/2021 3:55 PM

## 2021-05-15 ENCOUNTER — Other Ambulatory Visit: Payer: Self-pay

## 2021-05-15 ENCOUNTER — Ambulatory Visit (INDEPENDENT_AMBULATORY_CARE_PROVIDER_SITE_OTHER): Payer: BC Managed Care – PPO | Admitting: Obstetrics and Gynecology

## 2021-05-15 ENCOUNTER — Telehealth: Payer: Self-pay

## 2021-05-15 ENCOUNTER — Encounter: Payer: Self-pay | Admitting: Obstetrics and Gynecology

## 2021-05-15 VITALS — BP 90/60 | Temp 97.6°F | Ht 63.0 in | Wt 173.0 lb

## 2021-05-15 DIAGNOSIS — N61 Mastitis without abscess: Secondary | ICD-10-CM | POA: Diagnosis not present

## 2021-05-15 MED ORDER — DICLOXACILLIN SODIUM 500 MG PO CAPS: 500.0000 mg | ORAL_CAPSULE | Freq: Four times a day (QID) | ORAL | 0 refills | Status: AC

## 2021-05-15 NOTE — Telephone Encounter (Signed)
Pt calling; believes she has mastitis; be seen or can tx at home?  [phone]

## 2021-05-15 NOTE — Telephone Encounter (Signed)
Contacted patient via phone. Patient is schedule for today at 4:30 with ABC

## 2021-05-15 NOTE — Progress Notes (Signed)
Morayati, Faith Sale, MD   Chief Complaint  Patient presents with   Breast Exam    Possible mastitis, sore to touch since yesterday    HPI:      [name] is a 25 y.o. [phone] whose LMP was No LMP recorded., presents today for LT breast tenderness to touch since yesterday.  Has noticed some warmth to the breast, no fevers/chills, no erythema. Is BF and pumping every couple hrs. ~ 1 mo PP, doing well otherwise   Past Medical History:  Diagnosis Date   Medical history non-contributory    No known health problems     Past Surgical History:  Procedure Laterality Date   CESAREAN SECTION  04/21/2021   Procedure: CESAREAN SECTION;  Surgeon: Conard Novak, MD;  Location: [facility] ORS;  Service: Obstetrics;;   NO PAST SURGERIES     WISDOM TOOTH EXTRACTION  12/09/2019    Family History  Problem Relation Age of Onset   Diabetes Mother    Prostate cancer Father    Breast cancer Paternal Grandmother     Social History   Socioeconomic History   Marital status: Single    Spouse name: Not on file   Number of children: Not on file   Years of education: Not on file   Highest education level: Not on file  Occupational History   Not on file  Tobacco Use   Smoking status: Never   Smokeless tobacco: Never  Vaping Use   Vaping Use: Never used  Substance and Sexual Activity   Alcohol use: Not Currently   Drug use: Never   Sexual activity: Not Currently    Birth control/protection: None  Other Topics Concern   Not on file  Social History Narrative   Not on file   Social Determinants of Health   Financial Resource Strain: Not on file  Food Insecurity: Not on file  Transportation Needs: Not on file  Physical Activity: Not on file  Stress: Not on file  Social Connections: Not on file  Intimate Partner Violence: Not on file    Outpatient Medications Prior to Visit  Medication Sig Dispense Refill   Prenatal Vit-Fe Fumarate-FA (MULTIVITAMIN-PRENATAL) 27-0.8 MG  TABS tablet Take 1 tablet by mouth daily at 12 noon.     valACYclovir (VALTREX) 500 MG tablet Take 500 mg by mouth 2 (two) times daily.     No facility-administered medications prior to visit.      ROS:  Review of Systems  Constitutional:  Negative for fever.  Gastrointestinal:  Negative for blood in stool, constipation, diarrhea, nausea and vomiting.  Genitourinary:  Negative for dyspareunia, dysuria, flank pain, frequency, hematuria, urgency, vaginal bleeding, vaginal discharge and vaginal pain.  Musculoskeletal:  Negative for back pain.  Skin:  Negative for rash.  BREAST: tenderness   OBJECTIVE:   Vitals:  BP 90/60   Temp 97.6 F (36.4 C)   Ht 5\' 3"  (1.6 m)   Wt 173 lb (78.5 kg)   Breastfeeding Yes   BMI 30.65 kg/m   Physical Exam Vitals reviewed.  Pulmonary:     Effort: Pulmonary effort is normal.  Chest:  Breasts:    Breasts are symmetrical.     Right: No inverted nipple, mass, nipple discharge, skin change or tenderness.     Left: Skin change and tenderness present. No inverted nipple, mass or nipple discharge.    Musculoskeletal:        General: Normal range of motion.  Cervical back: Normal range of motion.  Skin:    General: Skin is warm and dry.  Neurological:     General: No focal deficit present.     Mental Status: She is alert and oriented to person, place, and time.     Cranial Nerves: No cranial nerve deficit.  Psychiatric:        Mood and Affect: Mood normal.        Behavior: Behavior normal.        Thought Content: Thought content normal.        Judgment: Judgment normal.    Assessment/Plan: Mastitis - Plan: dicloxacillin (DYNAPEN) 500 MG capsule; pos sx and exam, treat for mastitis. Rx diclox, warm compresses, NSAIDs prn pain. Cont to pump/BF regularly. F/u prn.    Meds ordered this encounter  Medications   dicloxacillin (DYNAPEN) 500 MG capsule    Sig: Take 1 capsule (500 mg total) by mouth 4 (four) times daily for 10 days.     Dispense:  40 capsule    Refill:  0    Order Specific Question:   Supervising Provider    Answer:   Nadara Mustard [[id number]]      Return if symptoms worsen or fail to improve.  Eloni Darius B. Cain Fitzhenry, PA-C 05/15/2021 4:47 PM

## 2021-05-31 ENCOUNTER — Ambulatory Visit (INDEPENDENT_AMBULATORY_CARE_PROVIDER_SITE_OTHER): Payer: BC Managed Care – PPO | Admitting: Obstetrics and Gynecology

## 2021-05-31 ENCOUNTER — Other Ambulatory Visit: Payer: Self-pay

## 2021-05-31 ENCOUNTER — Encounter: Payer: Self-pay | Admitting: Obstetrics and Gynecology

## 2021-05-31 ENCOUNTER — Other Ambulatory Visit (HOSPITAL_COMMUNITY)
Admission: RE | Admit: 2021-05-31 | Discharge: 2021-05-31 | Disposition: A | Discharge status: Home / Self Care | Payer: BC Managed Care – PPO | Source: Ambulatory Visit | Attending: Obstetrics and Gynecology | Admitting: Obstetrics and Gynecology

## 2021-05-31 DIAGNOSIS — Z113 Encounter for screening for infections with a predominantly sexual mode of transmission: Secondary | ICD-10-CM | POA: Diagnosis present

## 2021-05-31 DIAGNOSIS — Z30011 Encounter for initial prescription of contraceptive pills: Secondary | ICD-10-CM

## 2021-05-31 DIAGNOSIS — Z124 Encounter for screening for malignant neoplasm of cervix: Secondary | ICD-10-CM

## 2021-05-31 MED ORDER — NORETHINDRONE 0.35 MG PO TABS
1.0000 | ORAL_TABLET | Freq: Every day | ORAL | 4 refills | Status: DC
Start: 2021-05-31 — End: 2022-07-10

## 2021-05-31 NOTE — Progress Notes (Signed)
Postpartum Visit  Chief Complaint:  Chief Complaint  Patient presents with   Postpartum Care    History of Present Illness: Patient is a 25 y.o. [id number] presents for postpartum visit.  Date of delivery: 04/21/2021 Type of delivery: C-Section  Episiotomy No.  Laceration: no  Pregnancy or labor problems:  no Any problems since the delivery:  mastitis, still taking abx  Newborn Details:  SINGLETON :  1. Baby's name: Dallas. Birth weight: 6.10lb Maternal Details:  Breast Feeding:  yes Post partum depression/anxiety noted:  no Edinburgh Post-Partum Depression Score:  7  Date of last PAP: 08/06/2018  normal   Past Medical History:  Diagnosis Date   Medical history non-contributory    No known health problems     Past Surgical History:  Procedure Laterality Date   CESAREAN SECTION  04/21/2021   Procedure: CESAREAN SECTION;  Surgeon: Conard Novak, MD;  Location: [facility] ORS;  Service: Obstetrics;;   NO PAST SURGERIES     WISDOM TOOTH EXTRACTION  12/09/2019    Prior to Admission medications   Medication Sig Start Date End Date Taking? Authorizing Provider  Prenatal Vit-Fe Fumarate-FA (MULTIVITAMIN-PRENATAL) 27-0.8 MG TABS tablet Take 1 tablet by mouth daily at 12 noon.   Yes [provider]  valACYclovir (VALTREX) 500 MG tablet Take 500 mg by mouth 2 (two) times daily. 05/14/21   [provider]    No Known Allergies   Social History   Socioeconomic History   Marital status: Single    Spouse name: Not on file   Number of children: Not on file   Years of education: Not on file   Highest education level: Not on file  Occupational History   Not on file  Tobacco Use   Smoking status: Never   Smokeless tobacco: Never  Vaping Use   Vaping Use: Never used  Substance and Sexual Activity   Alcohol use: Not Currently   Drug use: Never   Sexual activity: Not Currently    Birth control/protection: None  Other Topics Concern   Not on file  Social  History Narrative   Not on file   Social Determinants of Health   Financial Resource Strain: Not on file  Food Insecurity: Not on file  Transportation Needs: Not on file  Physical Activity: Not on file  Stress: Not on file  Social Connections: Not on file  Intimate Partner Violence: Not on file    Family History  Problem Relation Age of Onset   Diabetes Mother    Prostate cancer Father    Breast cancer Paternal Grandmother     Review of Systems  Constitutional: Negative.   HENT: Negative.    Eyes: Negative.   Respiratory: Negative.    Cardiovascular: Negative.   Gastrointestinal: Negative.   Genitourinary: Negative.   Musculoskeletal: Negative.   Skin: Negative.   Neurological: Negative.   Psychiatric/Behavioral: Negative.      Physical Exam BP 122/74   Ht 5\' 3"  (1.6 m)   Wt 172 lb (78 kg)   Breastfeeding Yes   BMI 30.47 kg/m   Physical Exam Constitutional:      General: She is not in acute distress.    Appearance: Normal appearance. She is well-developed.  Genitourinary:     Vulva, bladder and urethral meatus normal.     Right Labia: No rash, tenderness, lesions, skin changes or Bartholin's cyst.    Left Labia: No tenderness, skin changes, Bartholin's cyst or rash.    No inguinal adenopathy  present in the right or left side.    Pelvic Tanner Score: 5/5.     Right Adnexa: not tender, not full and no mass present.    Left Adnexa: not tender, not full and no mass present.    No cervical motion tenderness, friability, lesion or polyp.     Uterus is not enlarged, fixed or tender.     Uterus is anteverted.     No urethral tenderness or mass present.     Pelvic exam was performed with patient in the lithotomy position.  HENT:     Head: Normocephalic and atraumatic.  Eyes:     General: No scleral icterus.    Conjunctiva/sclera: Conjunctivae normal.  Cardiovascular:     Rate and Rhythm: Normal rate and regular rhythm.     Heart sounds: No murmur heard.   No  friction rub. No gallop.  Pulmonary:     Effort: Pulmonary effort is normal. No respiratory distress.     Breath sounds: Normal breath sounds. No wheezing or rales.  Abdominal:     General: Bowel sounds are normal. There is no distension.     Palpations: Abdomen is soft. There is no mass.     Tenderness: There is no abdominal tenderness. There is no guarding or rebound.     Hernia: There is no hernia in the left inguinal area or right inguinal area.     Comments: Incision: without erythema, induration, warmth, and tenderness. It is clean, dry, and intact.   Musculoskeletal:        General: Normal range of motion.     Cervical back: Normal range of motion and neck supple.  Lymphadenopathy:     Lower Body: No right inguinal adenopathy. No left inguinal adenopathy.  Neurological:     General: No focal deficit present.     Mental Status: She is alert and oriented to person, place, and time.     Cranial Nerves: No cranial nerve deficit.  Skin:    General: Skin is warm and dry.     Findings: No erythema.  Psychiatric:        Mood and Affect: Mood normal.        Behavior: Behavior normal.        Judgment: Judgment normal.     Female Chaperone present during breast and/or pelvic exam.  Assessment: 25 y.o. [id number] presenting for 6 week postpartum visit  Plan: Problem List Items Addressed This Visit   None Visit Diagnoses     Postpartum care and examination    -  Primary   Relevant Medications   norethindrone (MICRONOR) 0.35 MG tablet   Other Relevant Orders   Cytology - PAP   Pap smear for cervical cancer screening       Relevant Orders   Cytology - PAP   Screen for STD (sexually transmitted disease)       Relevant Orders   Cytology - PAP   Encounter for initial prescription of contraceptive pills       Relevant Medications   norethindrone (MICRONOR) 0.35 MG tablet      1) Contraception: progesterone only pills  2)  Pap - ASCCP guidelines and rational discussed.   Patient opts for routine screening interval. Will get today  3) Patient underwent screening for postpartum depression with no concerns noted.  4) Follow up 1 year for routine annual exam  Thomasene Mohair, MD 05/31/2021 3:44 PM

## 2021-06-05 LAB — CYTOLOGY - PAP
Chlamydia: NEGATIVE
Comment: NEGATIVE
Comment: NORMAL
Diagnosis: NEGATIVE
Neisseria Gonorrhea: NEGATIVE

## 2021-10-31 IMAGING — US US OB COMP +14 WK
1 of 2 series · 13 of 28 positions shown · non-contrast
Comparison: none

CLINICAL DATA: Pregnancy.  Assess fetal anatomy.

EXAM:
OBSTETRICAL ULTRASOUND >14 WKS

[Series 1: us ob comp + 14 wk · 13 of 68 slices shown]
[im 3/68]
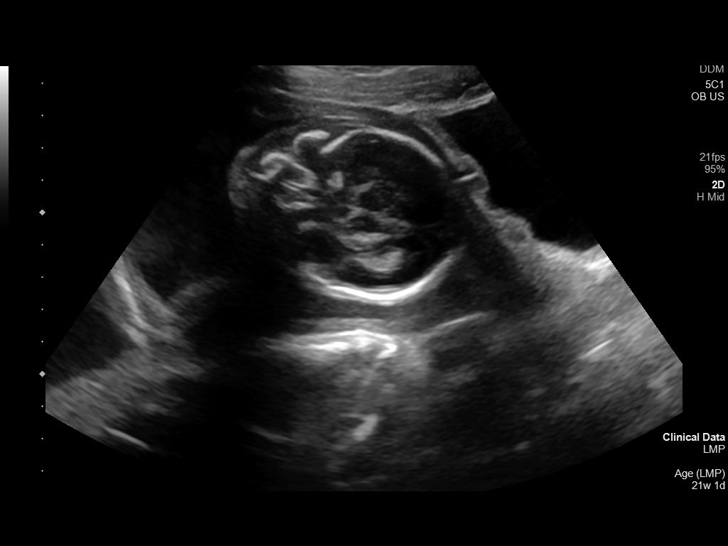
[im 8/68]
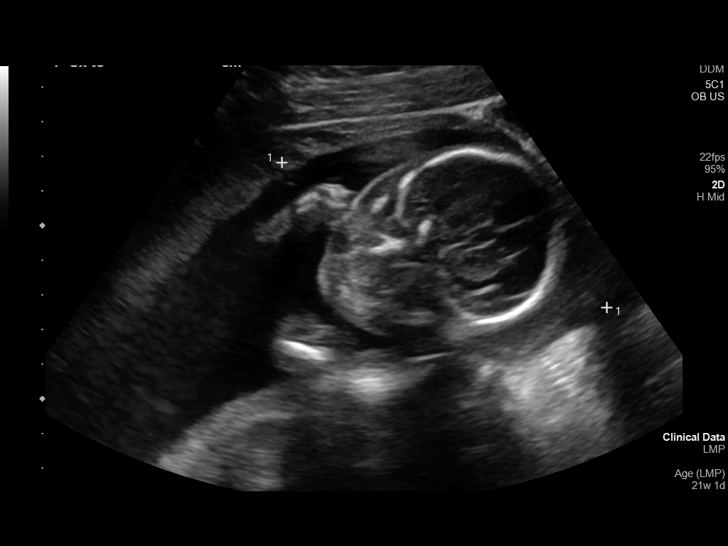
[im 13/68]
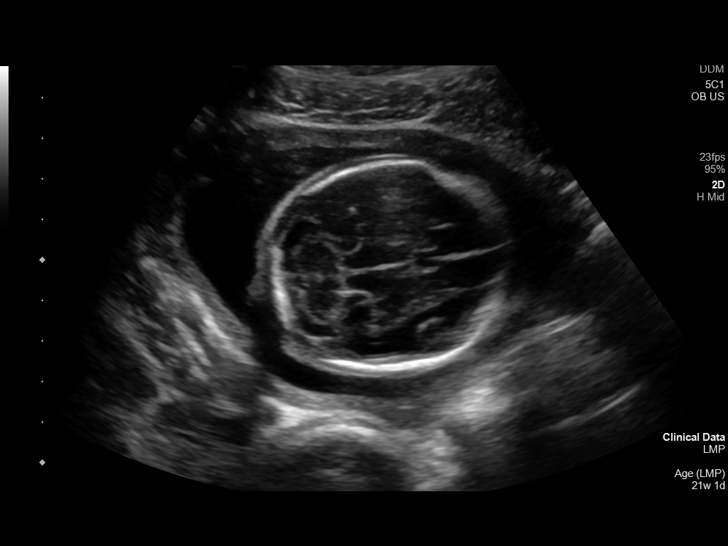
[im 19/68]
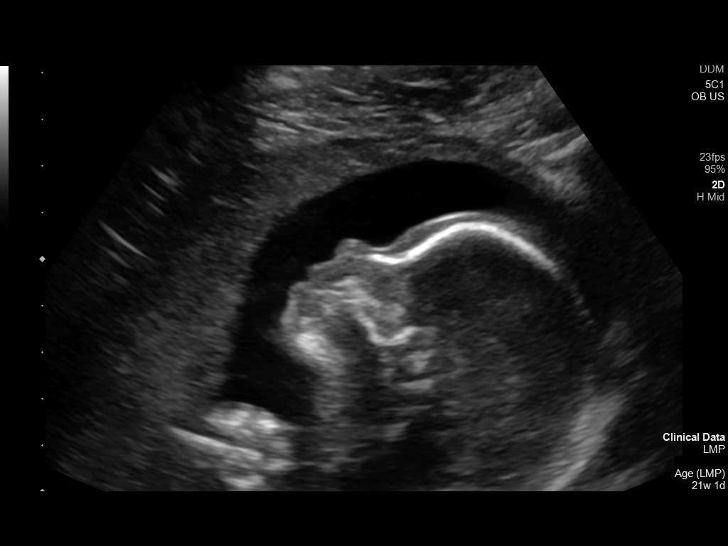
[im 24/68]
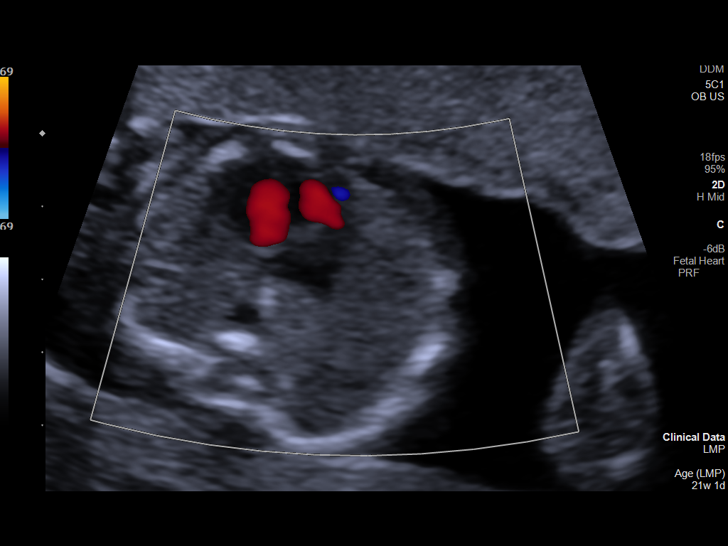
[im 29/68]
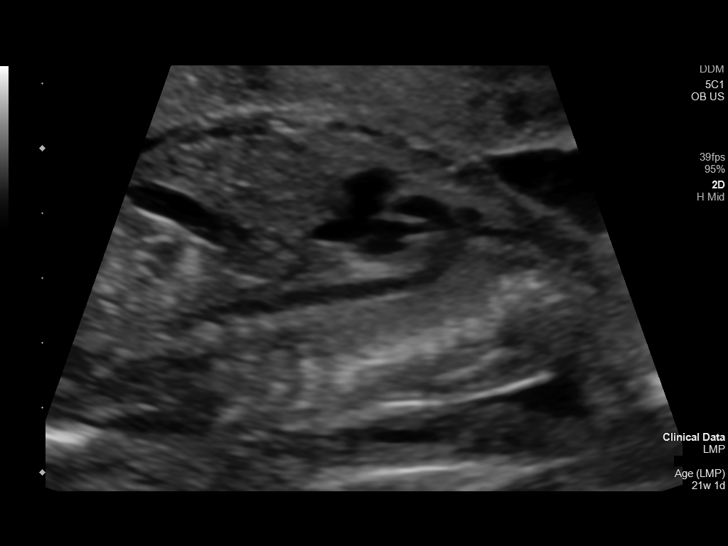
[im 37/68]
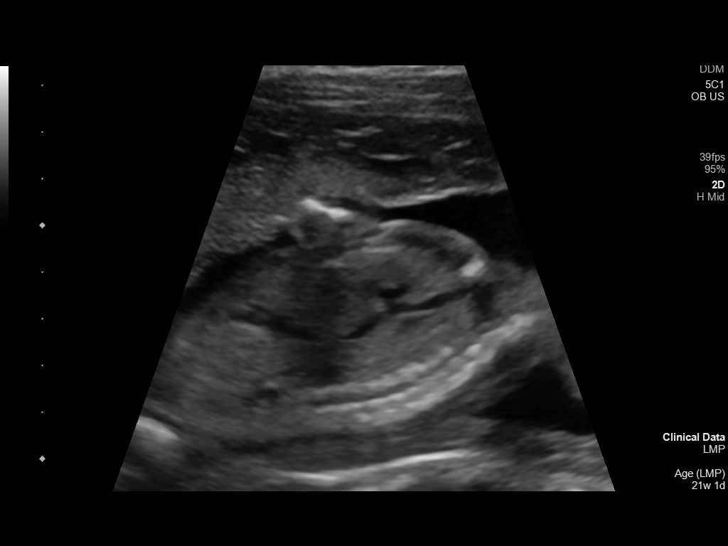
[im 42/68]
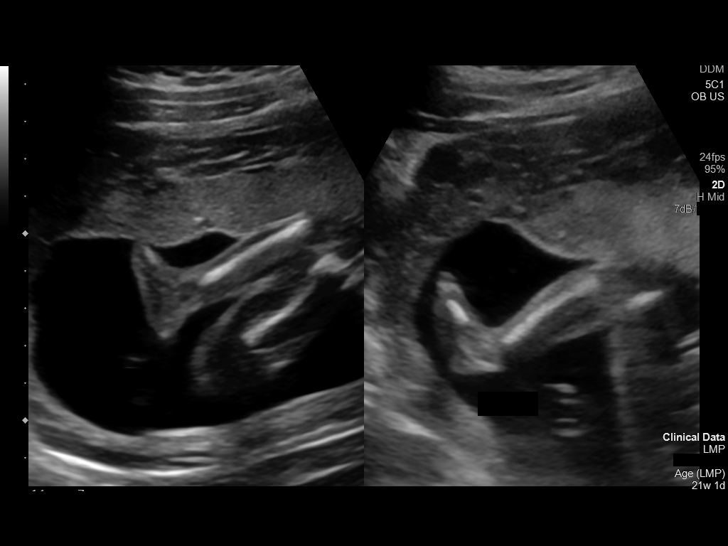
[im 47/68]
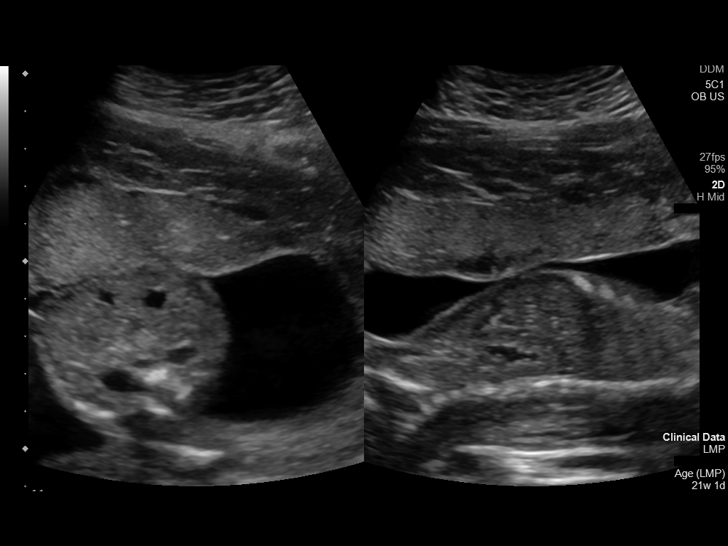
[im 52/68]
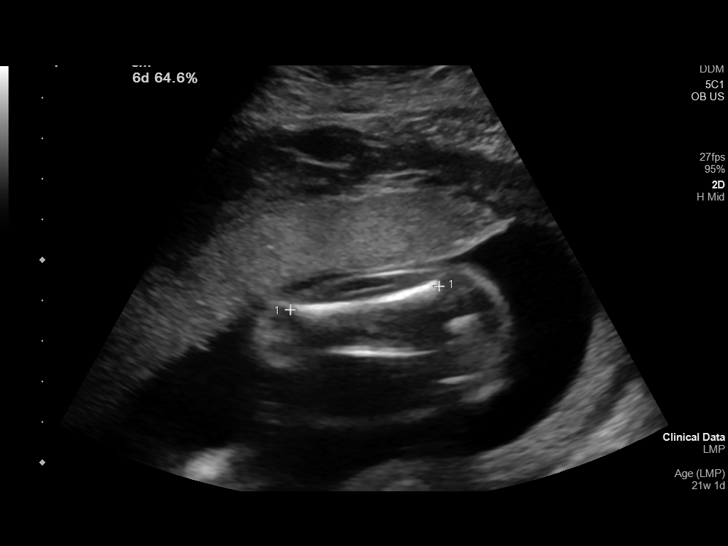
[im 57/68]
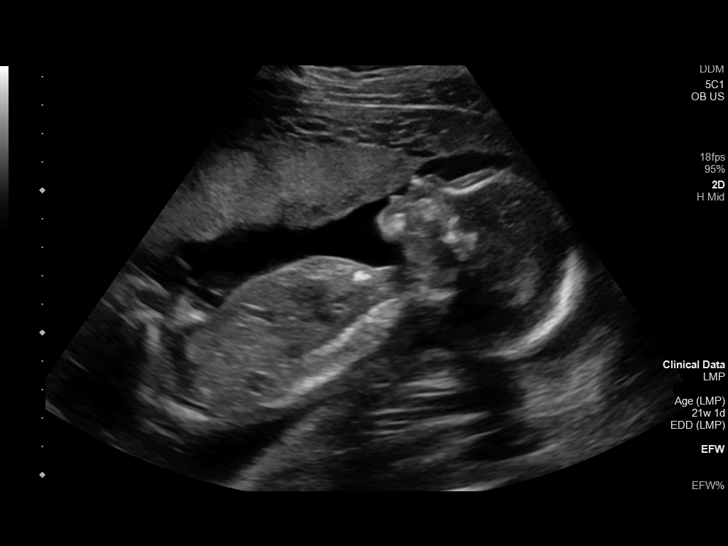
[im 62/68]
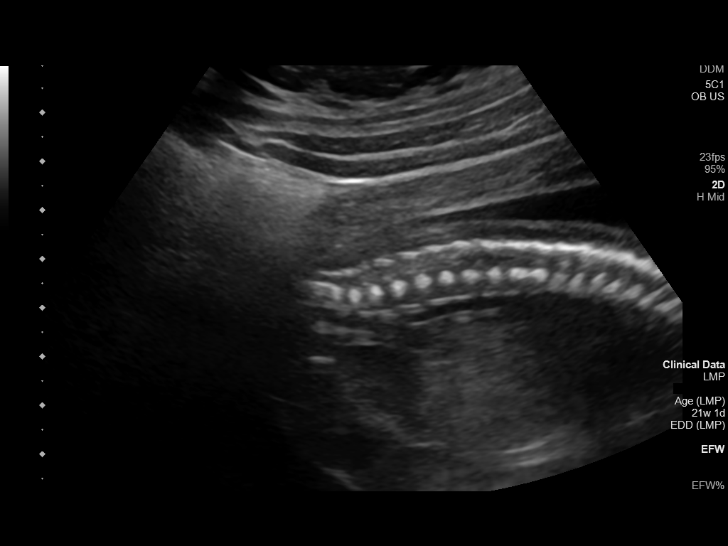
[im 68/68]
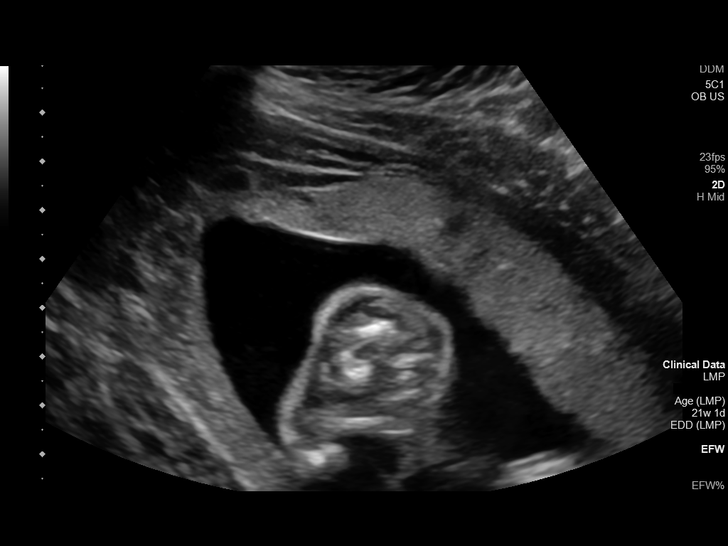

[13 of 28 positions shown; findings below may reference images not displayed]

FINDINGS: Number of Fetuses: 1

Heart Rate:  158 bpm

Movement: Yes

Presentation: Cephalic

Previa: No

Placental Location: Anterior

Amniotic Fluid (Subjective): Normal

Amniotic Fluid (Objective):

Vertical pocket = 4.5cm

FETAL BIOMETRY

BPD: 5.1cm 21w 3d

HC:   19.4cm 21w 4d

AC:   15.9cm 21w 0d

FL:   3.7cm 21w 4d

Current Mean GA: 21w 3d US EDC: 04/13/2021

Assigned GA:  21w 1d Assigned EDC: 04/15/2021

Estimated Fetal Weight:  416g 55%ile

FETAL ANATOMY

Lateral Ventricles: Appears normal

Thalami/CSP: Appears normal

Posterior Fossa:  Appears normal

Nuchal Region: Appears normal   NFT= N/A > 20 WKS

Upper Lip: Appears normal

Spine: Appears normal

4 Chamber Heart on Left: Appears normal

LVOT: Appears normal

RVOT: Appears normal

Stomach on Left: Appears normal

3 Vessel Cord: Appears normal

Cord Insertion site: Appears normal

Kidneys: Bilateral kidneys are visualized. Left renal pelvis AP
diameter of 6.2 mm. Right renal pelvis AP diameter of 4.4 mm. No
renal caliceal dilation identified.

Bladder: Appears normal

Extremities: Appears normal

Sex: Male

Technically difficult due to: None

Maternal Findings:

Cervix:  Closed.  4.5 cm.
IMPRESSION: 1. Single live intrauterine gestation in cephalic presentation.
Assigned gestational age of 21 weeks 1 day.
2. Bilateral fetal pyelectasis, left slightly greater than right,
which may be physiologic. Consider follow-up ultrasound after 28
weeks to assess for resolution.
3. Remainder of the fetal anatomic survey is within normal limits.

## 2021-12-27 IMAGING — US US OB FOLLOW-UP
1 series · 13 of 28 positions shown · non-contrast
Comparison: none

CLINICAL DATA: Follow-up fetal pyelectasis

EXAM:
OBSTETRIC 14+ WK ULTRASOUND FOLLOW-UP

[Series 1: us ob follow up · 13 of 64 slices shown]
[im 3/64]
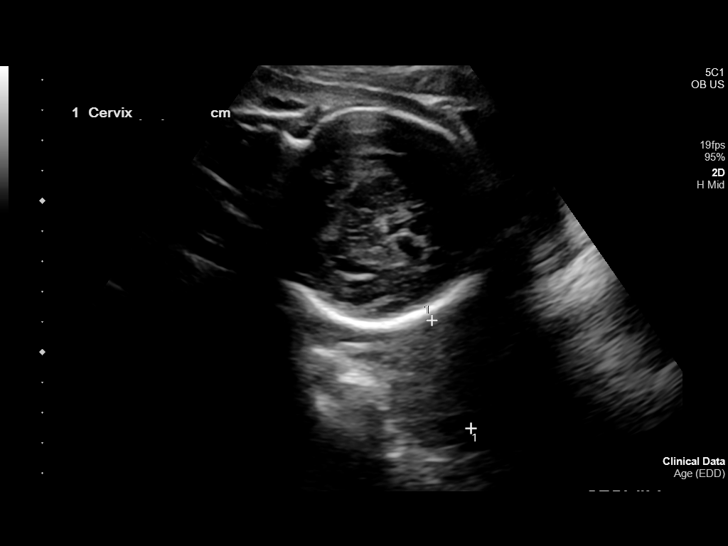
[im 8/64]
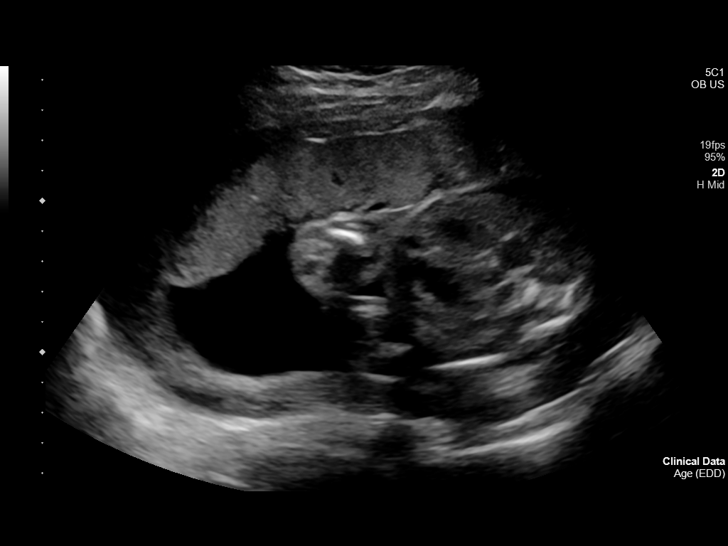
[im 12/64]
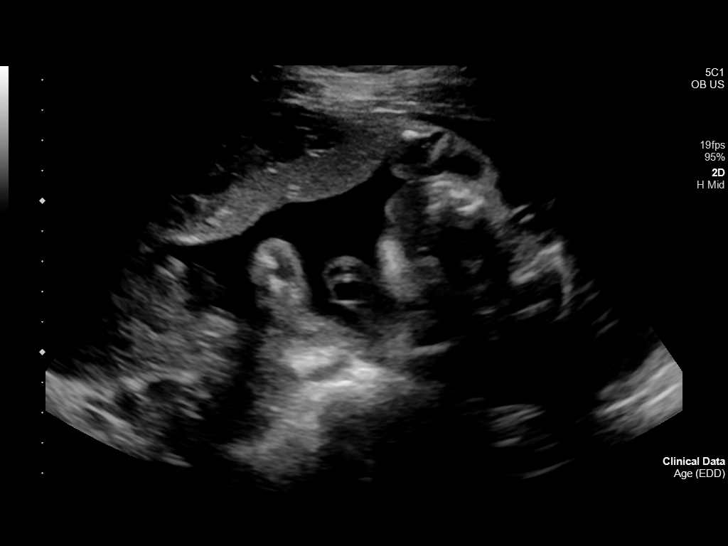
[im 17/64]
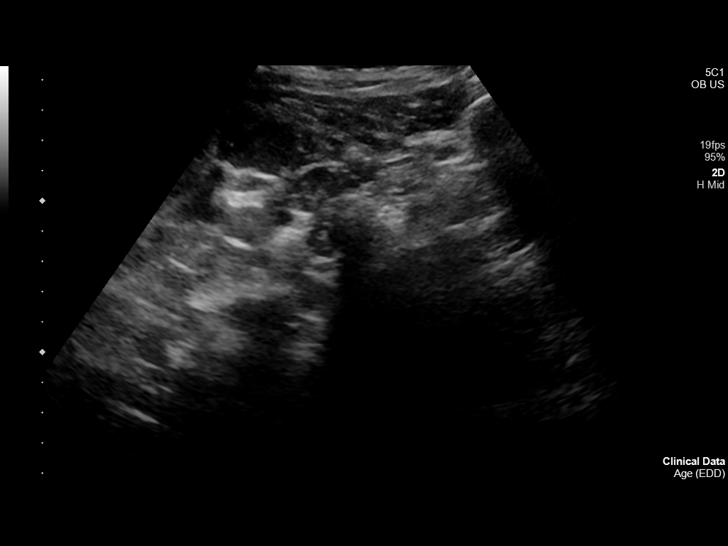
[im 22/64]
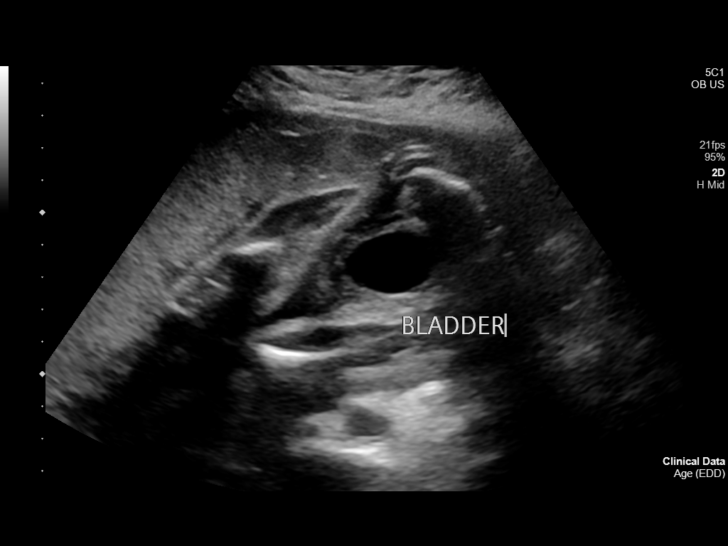
[im 26/64]
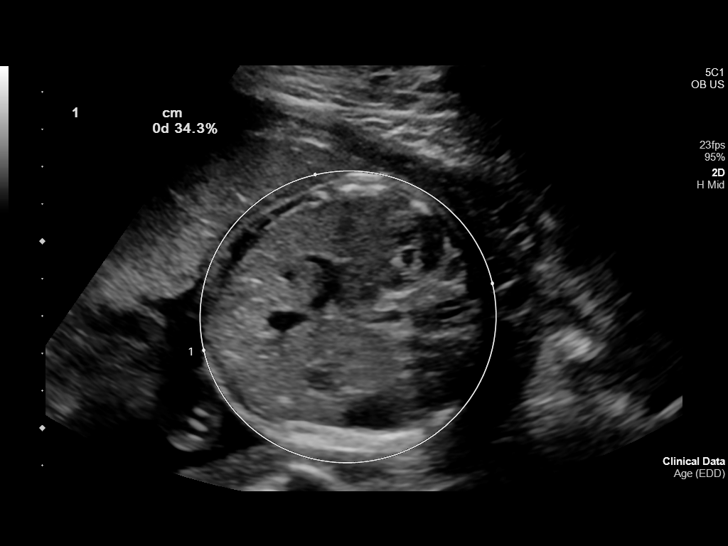
[im 33/64]
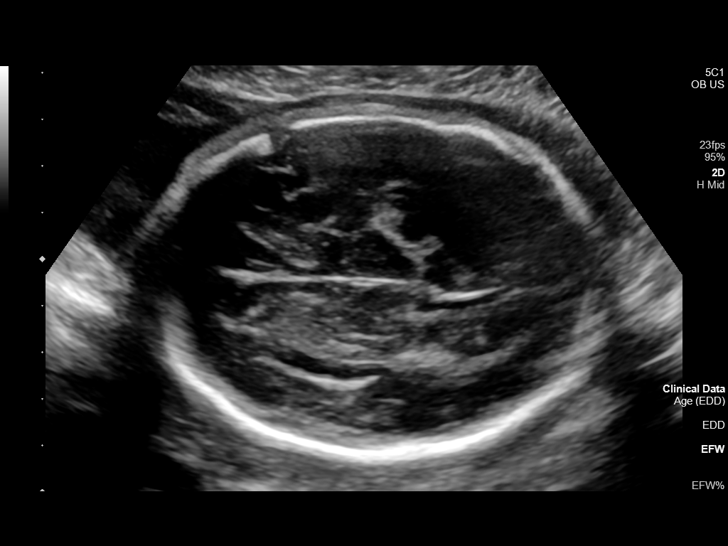
[im 38/64]
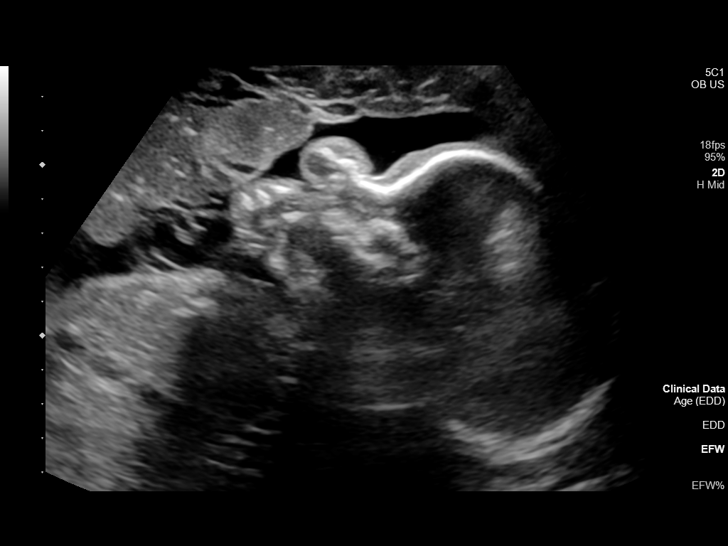
[im 43/64]
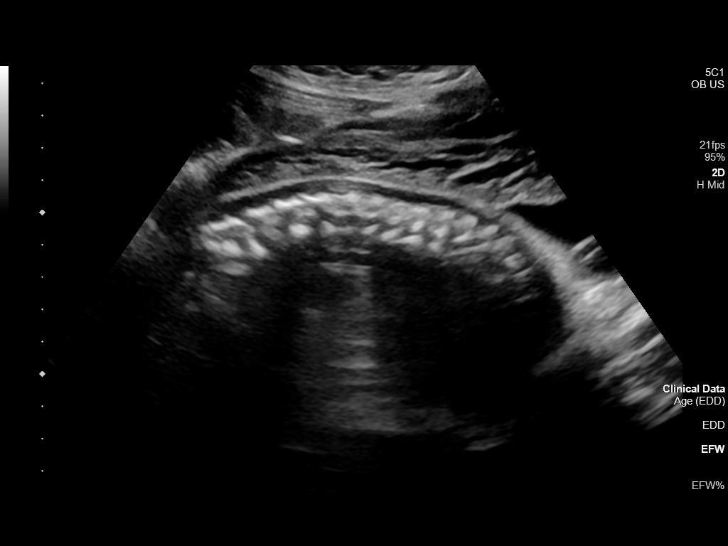
[im 47/64]
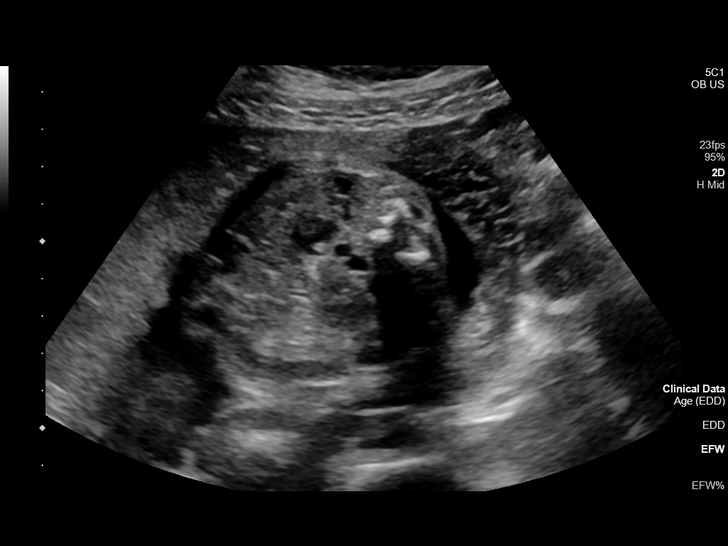
[im 52/64]
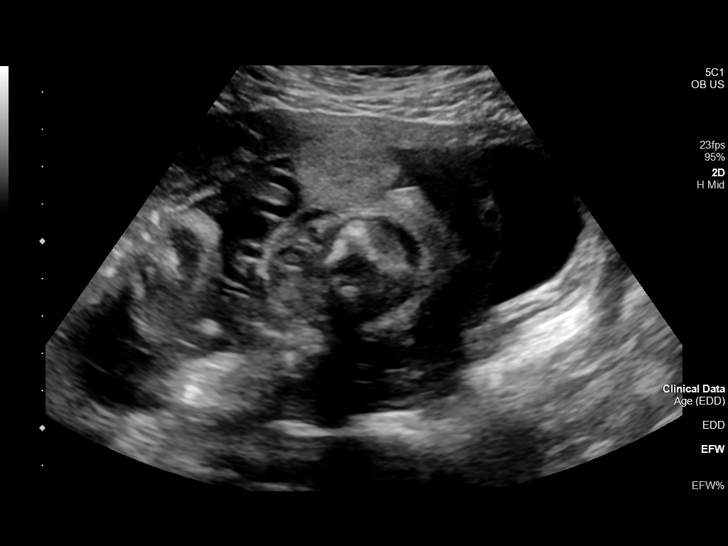
[im 57/64]
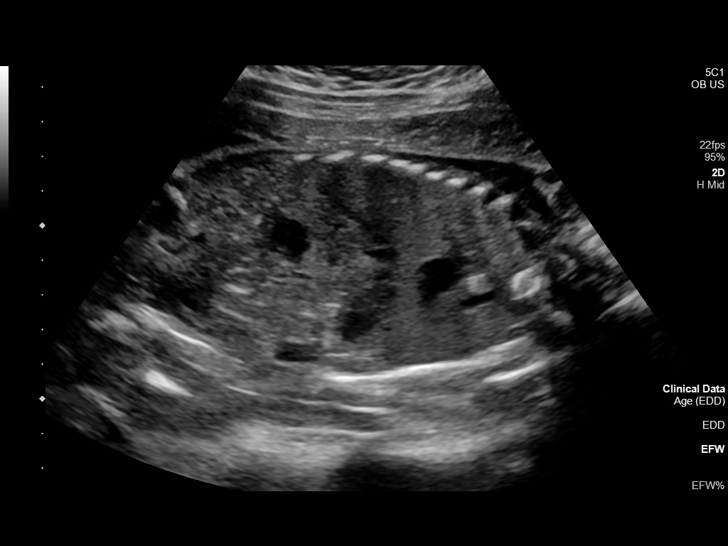
[im 61/64]
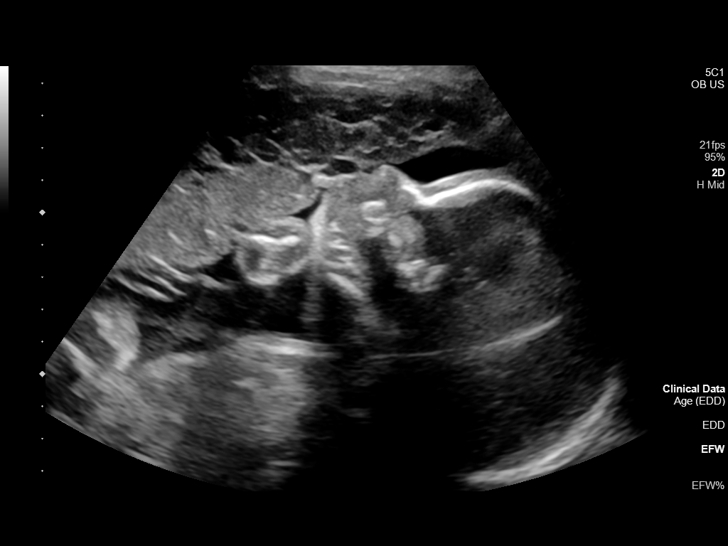

[13 of 28 positions shown; findings below may reference images not displayed]

FINDINGS: Number of Fetuses: 1

Heart Rate:  141 bpm

Movement: Yes

Presentation: Cephalic

Previa: No

Placental Location: Anterior

Amniotic Fluid (Subjective): Normal

Amniotic Fluid (Objective):

Vertical pocket 5.5cm

AFI 16.8 cm (5%ile= 9.2 cm, 95%= 23.1 cm for 29 wks)

FETAL BIOMETRY

BPD:  7.1cm 28w 2d

HC:    26.8cm 29w 2d

AC:    24.6cm 28w 6d

FL:    5.3cm 28w 0d

Current Mean GA: 28w 5d US EDC: 04/19/2021

Assigned GA: 29w 2d Assigned EDC: 04/15/2021

Estimated Fetal Weight:  1,245g 15%ile

FETAL ANATOMY

Lateral Ventricles: Previously seen

Thalami/CSP: Previously seen

Posterior Fossa: Previously seen

Nuchal Region: Previously seen

Upper Lip: Previously seen

Spine: Previously seen

4 Chamber Heart on Left: Previously seen

LVOT: Previously seen

RVOT: Previously seen

Stomach on Left: Previously seen

3 Vessel Cord: Previously seen

Cord Insertion site: Previously seen

Kidneys: Bilateral kidneys are visualized. Right renal pelvis AP
diameter of 5.1 mm (previously 4.4). Left renal pelvis AP diameter
of 7.5 mm (previously 6.2 mm). No renal caliceal dilation
identified. No ureteric dilation identified.

Bladder: Previously seen

Extremities: Previously seen

Sex: Previously Seen

Technical Limitations: None.

Maternal Findings:

Cervix:  Closed.  3.8 cm.
IMPRESSION: 1. Single live intrauterine gestation in cephalic presentation.
Assigned gestational age of 29 weeks 2 days.
2. Adequate interval growth.
3. Persistent mild left fetal pyelectasis measuring 7.5 mm in
diameter. Follow-up third trimester ultrasound is recommended to
assess for resolution.
4. Right renal pelvis diameter of 5.1 mm is within normal limits for
gestational age.

## 2022-07-10 ENCOUNTER — Other Ambulatory Visit (HOSPITAL_COMMUNITY)
Admission: RE | Admit: 2022-07-10 | Discharge: 2022-07-10 | Disposition: A | Discharge status: Home / Self Care | Payer: Medicaid Other | Source: Ambulatory Visit | Attending: Obstetrics | Admitting: Obstetrics

## 2022-07-10 ENCOUNTER — Ambulatory Visit (INDEPENDENT_AMBULATORY_CARE_PROVIDER_SITE_OTHER): Payer: Medicaid Other | Admitting: Obstetrics

## 2022-07-10 ENCOUNTER — Encounter: Payer: Self-pay | Admitting: Obstetrics

## 2022-07-10 VITALS — BP 112/68 | HR 98 | Ht 63.0 in | Wt 167.0 lb

## 2022-07-10 DIAGNOSIS — Z01419 Encounter for gynecological examination (general) (routine) without abnormal findings: Secondary | ICD-10-CM

## 2022-07-10 DIAGNOSIS — Z113 Encounter for screening for infections with a predominantly sexual mode of transmission: Secondary | ICD-10-CM | POA: Diagnosis not present

## 2022-07-10 DIAGNOSIS — Z3009 Encounter for other general counseling and advice on contraception: Secondary | ICD-10-CM

## 2022-07-10 DIAGNOSIS — Z23 Encounter for immunization: Secondary | ICD-10-CM

## 2022-07-10 MED ORDER — NORETHINDRONE 0.35 MG PO TABS: 1.0000 | ORAL_TABLET | Freq: Every day | ORAL | 3 refills | Status: DC

## 2022-07-10 NOTE — Progress Notes (Signed)
SUBJECTIVE  HPI  Faith Gonzales is a 26 y.o.-year-old [id number] who presents for an annual gynecological exam and routine STI testing. She has no health concerns today. Her menses resumed last month after she stopped breastfeeding. She is currently sexually active and desires contraception. She denies pelvic pain, dyspareunia, unusual vaginal bleeding or discharge, and UTI symptoms.  Medical/Surgical History Past Medical History:  Diagnosis Date   Medical history non-contributory    No known health problems    Past Surgical History:  Procedure Laterality Date   CESAREAN SECTION  04/21/2021   Procedure: CESAREAN SECTION;  Surgeon: Faith Novak, MD;  Location: [facility] ORS;  Service: Obstetrics;;   NO PAST SURGERIES     WISDOM TOOTH EXTRACTION  12/09/2019    Social History Lives with parents and son Work: Nurse, learning disability Exercise: works out at gym Substances: occasional EtOH. Denies tobacco, vape, and recreational drugs  Obstetric History OB History     Gravida  3   Para  1   Term  1   Preterm      AB  2   Living  1      SAB      IAB  1   Ectopic      Multiple  0   Live Births  1            GYN/Menstrual History Patient's last menstrual period was 06/19/2022 (exact date). Periods have been irregular. Last Pap: 05/31/2021 Contraception: none  Prevention Mammogram at 40 Colonoscopy at 45 Flu shot/vaccines: desires flu shot today  Current Medications Outpatient Medications Prior to Visit  Medication Sig   valACYclovir (VALTREX) 500 MG tablet Take 500 mg by mouth 2 (two) times daily.   [DISCONTINUED] norethindrone (MICRONOR) 0.35 MG tablet Take 1 tablet (0.35 mg total) by mouth daily.   [DISCONTINUED] Prenatal Vit-Fe Fumarate-FA (MULTIVITAMIN-PRENATAL) 27-0.8 MG TABS tablet Take 1 tablet by mouth daily at 12 noon.   No facility-administered medications prior to visit.      Upstream - 07/10/22 0900       Pregnancy Intention Screening   Does the  patient want to become pregnant in the next year? No    Does the patient's partner want to become pregnant in the next year? No    Would the patient like to discuss contraceptive options today? Yes      Contraception Wrap Up   Current Method No Contraceptive Precautions    End Method Oral Contraceptive    How was the end contraceptive method provided? Prescription            The pregnancy intention screening data noted above was reviewed. Potential methods of contraception were discussed. The patient elected to proceed with Oral Contraceptive.   ROS History obtained from the patient General ROS: negative for - chills, fatigue, fever, or night sweats Psychological ROS: negative Ophthalmic ROS: negative for - blurry vision or decreased vision ENT ROS: negative for - headaches, sinus pain, or sore throat Hematological and Lymphatic ROS: negative for - bleeding problems, bruising, or swollen lymph nodes Endocrine ROS: negative Breast ROS: negative for breast lumps Respiratory ROS: no cough, shortness of breath, or wheezing Cardiovascular ROS: no chest pain or dyspnea on exertion Gastrointestinal ROS: no abdominal pain, change in bowel habits, or black or bloody stools Genito-Urinary ROS: no dysuria, trouble voiding, or hematuria Musculoskeletal ROS: negative Dermatological ROS: negative      No data to display  OBJECTIVE  Last Weight  Most recent update: 07/10/2022  8:26 AM    Weight  75.8 kg (167 lb)             Body mass index is 29.58 kg/m.    BP 112/68   Pulse 98   Ht 5\' 3"  (1.6 m)   Wt 167 lb (75.8 kg)   LMP 06/19/2022 (Exact Date)   Breastfeeding No   BMI 29.58 kg/m  General appearance: alert, cooperative, and appears stated age Head: Normocephalic, without obvious abnormality, atraumatic Eyes: negative findings: lids and lashes normal and conjunctivae and sclerae normal Neck: no adenopathy, supple, symmetrical, trachea midline, and thyroid  not enlarged, symmetric, no tenderness/mass/nodules Lungs: clear to auscultation bilaterally Breasts: normal appearance, no masses or tenderness, Inspection negative, No nipple retraction or dimpling, No nipple discharge or bleeding, No axillary or supraclavicular adenopathy, Normal to palpation without dominant masses, Taught monthly breast self examination Heart: regular rate and rhythm, S1, S2 normal, no murmur, click, rub or gallop Abdomen: soft, non-tender; bowel sounds normal; no masses,  no organomegaly Pelvic: cervix normal in appearance, external genitalia normal, no adnexal masses or tenderness, no cervical motion tenderness, rectovaginal septum normal, uterus normal size, shape, and consistency, vagina normal without discharge, and swabs collected Extremities: extremities normal, atraumatic, no cyanosis or edema Pulses: 2+ and symmetric Skin: Skin color, texture, turgor normal. No rashes or lesions Lymph nodes: Cervical, supraclavicular, and axillary nodes normal.  ASSESSMENT  1) Annual exam 2) Desires contraception  PLAN 1) Physical exam as noted. Discussed healthy lifestyle choices and preventive care. STI swabs and blood work collected. Labs: TSH, CBC, lipid profile, A1C. Pap due in 2025. 2) Discussed contraception options. [name] would like to start Micronor at this time. Instructions on proper administration and potential side effects given. Rx sent to pharmacy on file.  Return in one year for annual exam or as needed for concerns.   Melvenia Beam, CNM

## 2022-07-11 LAB — LIPID PANEL
Chol/HDL Ratio: 2.1 ratio (ref 0.0–4.4)
Cholesterol, Total: 172 mg/dL (ref 100–199)
HDL: 83 mg/dL (ref >39)
LDL Chol Calc (NIH): 77 mg/dL (ref 0–99)
Triglycerides: 62 mg/dL (ref 0–149)
VLDL Cholesterol Cal: 12 mg/dL (ref 5–40)

## 2022-07-11 LAB — HEMOGLOBIN A1C
Est. average glucose Bld gHb Est-mCnc: 108 mg/dL
Hgb A1c MFr Bld: 5.4 % (ref 4.8–5.6)

## 2022-07-11 LAB — CBC
Hematocrit: 39.8 % (ref 34.0–46.6)
Hemoglobin: 13.7 g/dL (ref 11.1–15.9)
MCH: 32.3 pg (ref 26.6–33.0)
MCHC: 34.4 g/dL (ref 31.5–35.7)
MCV: 94 fL (ref 79–97)
Platelets: 205 10*3/uL (ref 150–450)
RBC: 4.24 x10E6/uL (ref 3.77–5.28)
RDW: 12.1 % (ref 11.7–15.4)
WBC: 4.9 10*3/uL (ref 3.4–10.8)

## 2022-07-11 LAB — HEPATITIS B SURFACE ANTIGEN: Hepatitis B Surface Ag: NEGATIVE

## 2022-07-11 LAB — RPR: RPR Ser Ql: NONREACTIVE

## 2022-07-11 LAB — HIV ANTIBODY (ROUTINE TESTING W REFLEX): HIV Screen 4th Generation wRfx: NONREACTIVE

## 2022-07-11 LAB — TSH: TSH: 1.34 u[IU]/mL (ref 0.450–4.500)

## 2022-07-11 LAB — HEPATITIS B SURFACE ANTIBODY,QUALITATIVE: Hep B Surface Ab, Qual: NONREACTIVE

## 2022-07-11 LAB — HEPATITIS C ANTIBODY: Hep C Virus Ab: NONREACTIVE

## 2022-07-12 ENCOUNTER — Other Ambulatory Visit: Payer: Self-pay | Admitting: Obstetrics

## 2022-07-12 ENCOUNTER — Encounter: Payer: Self-pay | Admitting: Obstetrics

## 2022-07-12 LAB — CERVICOVAGINAL ANCILLARY ONLY
Bacterial Vaginitis (gardnerella): POSITIVE — AB
Candida Glabrata: NEGATIVE
Candida Vaginitis: NEGATIVE
Chlamydia: NEGATIVE
Comment: NEGATIVE
Comment: NEGATIVE
Comment: NEGATIVE
Comment: NEGATIVE
Comment: NEGATIVE
Comment: NORMAL
Neisseria Gonorrhea: NEGATIVE
Trichomonas: NEGATIVE

## 2022-07-12 MED ORDER — METRONIDAZOLE 500 MG PO TABS: 500.0000 mg | ORAL_TABLET | Freq: Two times a day (BID) | ORAL | 0 refills | Status: DC

## 2022-07-12 NOTE — Progress Notes (Signed)
+  BV. Rx for metronidazole sent to pharmacy. Zera notified via MyChart.  M. Chryl Heck, CNM

## 2023-06-09 ENCOUNTER — Other Ambulatory Visit (HOSPITAL_COMMUNITY)
Admission: RE | Admit: 2023-06-09 | Discharge: 2023-06-09 | Disposition: A | Discharge status: Home / Self Care | Payer: Managed Care, Other (non HMO) | Source: Ambulatory Visit | Attending: Certified Nurse Midwife | Admitting: Certified Nurse Midwife

## 2023-06-09 ENCOUNTER — Ambulatory Visit: Payer: Managed Care, Other (non HMO)

## 2023-06-09 VITALS — BP 128/62 | HR 68 | Resp 16 | Ht 63.0 in | Wt 178.5 lb

## 2023-06-09 DIAGNOSIS — N898 Other specified noninflammatory disorders of vagina: Secondary | ICD-10-CM | POA: Insufficient documentation

## 2023-06-09 NOTE — Patient Instructions (Signed)

## 2023-06-09 NOTE — Progress Notes (Cosign Needed Addendum)
    NURSE VISIT NOTE  Subjective:    Patient ID: DIASHA CASTLEMAN, female    DOB: December 20, 1995, 27 y.o.   MRN: [id number]  HPI  Patient is a 27 y.o. [id number] female who presents for mild odor, dryness,brown vaginal discharge for 6 day(s). Denies abnormal vaginal bleeding or significant pelvic pain or fever. denies dysuria, hematuria, urinary frequency, and urinary urgency. Patient denies history of known exposure to STD.   Objective:    BP 128/62   Pulse 68   Resp 16   Ht 5\' 3"  (1.6 m)   Wt 178 lb 8 oz (81 kg)   BMI 31.62 kg/m    @THIS  VISIT ONLY@  Assessment:   1. Vaginal odor     bacterial vaginosis  Plan:   GC and chlamydia DNA  probe sent to lab. Treatment: Flagyl 500 BID x 7 days and abstain from coitus during course of treatment, if test come back positive Samples of PH-d given. ROV prn if symptoms persist or worsen.     Santiago Bumpers, CMA [facility]

## 2023-06-11 ENCOUNTER — Other Ambulatory Visit: Payer: Self-pay

## 2023-06-11 DIAGNOSIS — B9689 Other specified bacterial agents as the cause of diseases classified elsewhere: Secondary | ICD-10-CM

## 2023-06-11 LAB — CERVICOVAGINAL ANCILLARY ONLY
Bacterial Vaginitis (gardnerella): POSITIVE — AB
Candida Glabrata: NEGATIVE
Candida Vaginitis: NEGATIVE
Chlamydia: NEGATIVE
Comment: NEGATIVE
Comment: NEGATIVE
Comment: NEGATIVE
Comment: NEGATIVE
Comment: NEGATIVE
Comment: NORMAL
Neisseria Gonorrhea: NEGATIVE
Trichomonas: NEGATIVE

## 2023-06-11 MED ORDER — METRONIDAZOLE 500 MG PO TABS: 500.0000 mg | ORAL_TABLET | Freq: Two times a day (BID) | ORAL | 0 refills | Status: DC

## 2023-08-11 ENCOUNTER — Other Ambulatory Visit: Payer: Self-pay

## 2023-08-11 ENCOUNTER — Emergency Department: Payer: Managed Care, Other (non HMO)

## 2023-08-11 ENCOUNTER — Encounter: Payer: Self-pay | Admitting: Emergency Medicine

## 2023-08-11 DIAGNOSIS — R0789 Other chest pain: Secondary | ICD-10-CM | POA: Insufficient documentation

## 2023-08-11 DIAGNOSIS — R42 Dizziness and giddiness: Secondary | ICD-10-CM | POA: Insufficient documentation

## 2023-08-11 LAB — CBC WITH DIFFERENTIAL/PLATELET
Abs Immature Granulocytes: 0.01 10*3/uL (ref 0.00–0.07)
Basophils Absolute: 0 10*3/uL (ref 0.0–0.1)
Basophils Relative: 0 %
Eosinophils Absolute: 0.4 10*3/uL (ref 0.0–0.5)
Eosinophils Relative: 7 %
HCT: 41.7 % (ref 36.0–46.0)
Hemoglobin: 14.5 g/dL (ref 12.0–15.0)
Immature Granulocytes: 0 %
Lymphocytes Relative: 48 %
Lymphs Abs: 2.8 10*3/uL (ref 0.7–4.0)
MCH: 33 pg (ref 26.0–34.0)
MCHC: 34.8 g/dL (ref 30.0–36.0)
MCV: 94.8 fL (ref 80.0–100.0)
Monocytes Absolute: 0.3 10*3/uL (ref 0.1–1.0)
Monocytes Relative: 6 %
Neutro Abs: 2.3 10*3/uL (ref 1.7–7.7)
Neutrophils Relative %: 39 %
Platelets: 246 10*3/uL (ref 150–400)
RBC: 4.4 MIL/uL (ref 3.87–5.11)
RDW: 12.3 % (ref 11.5–15.5)
WBC: 5.8 10*3/uL (ref 4.0–10.5)
nRBC: 0 % (ref 0.0–0.2)

## 2023-08-11 LAB — POC URINE PREG, ED: Preg Test, Ur: POSITIVE — AB

## 2023-08-11 NOTE — ED Triage Notes (Signed)
Patient ambulatory to triage with steady gait, without difficulty or distress noted; pt reports tonight having mid CP radiating into "rt arm, back and head" accomp by dizziness; denies hx of same and denies symptoms at present

## 2023-08-12 ENCOUNTER — Emergency Department
Admission: EM | Admit: 2023-08-12 | Discharge: 2023-08-12 | Disposition: A | Discharge status: Home / Self Care | Payer: Managed Care, Other (non HMO) | Attending: Emergency Medicine | Admitting: Emergency Medicine

## 2023-08-12 DIAGNOSIS — R079 Chest pain, unspecified: Secondary | ICD-10-CM

## 2023-08-12 DIAGNOSIS — R0789 Other chest pain: Secondary | ICD-10-CM | POA: Diagnosis not present

## 2023-08-12 LAB — URINALYSIS, ROUTINE W REFLEX MICROSCOPIC
Bacteria, UA: NONE SEEN
Bilirubin Urine: NEGATIVE
Glucose, UA: NEGATIVE mg/dL
Hgb urine dipstick: NEGATIVE
Ketones, ur: NEGATIVE mg/dL
Leukocytes,Ua: NEGATIVE
Nitrite: NEGATIVE
Protein, ur: NEGATIVE mg/dL
Specific Gravity, Urine: 1.015 (ref 1.005–1.030)
pH: 6 (ref 5.0–8.0)

## 2023-08-12 LAB — COMPREHENSIVE METABOLIC PANEL
ALT: 13 U/L (ref 0–44)
AST: 24 U/L (ref 15–41)
Albumin: 4 g/dL (ref 3.5–5.0)
Alkaline Phosphatase: 56 U/L (ref 38–126)
Anion gap: 10 (ref 5–15)
BUN: 14 mg/dL (ref 6–20)
CO2: 22 mmol/L (ref 22–32)
Calcium: 8.9 mg/dL (ref 8.9–10.3)
Chloride: 102 mmol/L (ref 98–111)
Creatinine, Ser: 0.77 mg/dL (ref 0.44–1.00)
GFR, Estimated: >60 mL/min (ref >60)
Glucose, Bld: 115 mg/dL — ABNORMAL HIGH (ref 70–99)
Potassium: 3.5 mmol/L (ref 3.5–5.1)
Sodium: 134 mmol/L — ABNORMAL LOW (ref 135–145)
Total Bilirubin: 0.6 mg/dL (ref <1.2)
Total Protein: 7.5 g/dL (ref 6.5–8.1)

## 2023-08-12 LAB — TROPONIN I (HIGH SENSITIVITY)
Troponin I (High Sensitivity): <2 ng/L (ref <18)
Troponin I (High Sensitivity): <2 ng/L (ref <18)

## 2023-08-12 LAB — HCG, QUANTITATIVE, PREGNANCY: hCG, Beta Chain, Quant, S: 34 m[IU]/mL — ABNORMAL HIGH (ref <5)

## 2023-08-12 LAB — D-DIMER, QUANTITATIVE: D-Dimer, Quant: <0.27 ug{FEU}/mL (ref 0.00–0.50)

## 2023-08-12 NOTE — Discharge Instructions (Signed)
Return to the ER for worsening symptoms, persistent vomiting, difficulty breathing or other concerns. °

## 2023-08-12 NOTE — ED Provider Notes (Signed)
[facility] Provider Note    Event Date/Time   First MD Initiated Contact with Patient 08/12/23 [phone]     (approximate)   History   Chest Pain   HPI  Faith Gonzales is a 27 y.o. female who presents to the ED from home with a chief complaint of mid central chest pain radiating into her right arm, back and head accompanied by dizziness.  Symptoms lasting a few minutes, not recurrent.  Denies associated diaphoresis, palpitations, nausea/vomiting.  Denies recent fever/chills, cough, abdominal pain, dysuria or diarrhea.  Stopped OCPs 2 months ago.  Denies recent travel or trauma.     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 Authorizing Provider  metroNIDAZOLE (FLAGYL) 500 MG tablet Take 1 tablet (500 mg total) by mouth 2 (two) times daily. 06/11/23   Dominica Severin, CNM  valACYclovir (VALTREX) 500 MG tablet Take 500 mg by mouth 2 (two) times daily. 05/14/21   [provider]     Allergies  Patient has no known allergies.   Family History    Family History  Problem Relation Age of Onset   Diabetes Mother    Prostate cancer Father    Breast cancer Paternal Grandmother      Physical Exam  Triage Vital Signs: ED Triage Vitals  Encounter Vitals Group     BP 08/11/23 2333 132/87     Systolic BP Percentile --      Diastolic BP Percentile --      Pulse Rate 08/11/23 2333 96     Resp 08/11/23 2333 19     Temp 08/11/23 2333 98.2 F (36.8 C)     Temp Source 08/11/23 2333 Oral     SpO2 08/11/23 2333 100 %     Weight 08/11/23 2318 175 lb (79.4 kg)     Height 08/11/23 2318 5\' 3"  (1.6 m)     Head Circumference --      Peak Flow --      Pain Score 08/11/23 2318 0     Pain Loc --      Pain Education --      Exclude from Growth Chart --     Updated Vital Signs: BP 126/72   Pulse 76   Temp 98.9 F (37.2 C)   Resp 18   Ht 5\' 3"  (1.6 m)   Wt 79.4 kg   LMP 08/11/2023 (Exact Date)   SpO2 100%   BMI 31.00 kg/m    General: Awake, no distress.  CV:  RRR.  Good peripheral perfusion.  Resp:  Normal effort.  CTAB. Abd:  Nontender.  No distention.  Other:  Bilateral calves are supple and nontender.   ED Results / Procedures / Treatments  Labs (all labs ordered are listed, but only abnormal results are displayed) Labs Reviewed  COMPREHENSIVE METABOLIC PANEL - Abnormal; Notable for the following components:      Result Value   Sodium 134 (*)    Glucose, Bld 115 (*)    All other components within normal limits  URINALYSIS, ROUTINE W REFLEX MICROSCOPIC - Abnormal; Notable for the following components:   Color, Urine YELLOW (*)    APPearance CLEAR (*)    All other components within normal limits  HCG, QUANTITATIVE, PREGNANCY - Abnormal; Notable for the following components:   hCG, Beta Chain, Quant, S 34 (*)    All other components within normal limits  POC URINE PREG, ED - Abnormal; Notable for the following components:   Preg Test, Ur Positive (*)    All other components within normal limits  CBC WITH  DIFFERENTIAL/PLATELET  D-DIMER, QUANTITATIVE  TROPONIN I (HIGH SENSITIVITY)  TROPONIN I (HIGH SENSITIVITY)     EKG  ED ECG REPORT I, Toney Difatta J, the attending physician, personally viewed and interpreted this ECG.   Date: 08/12/2023  EKG Time: 2324  Rate: 93  Rhythm: normal sinus rhythm  Axis: Normal  Intervals:none  ST&T Change: Nonspecific    RADIOLOGY I have independently visualized and interpreted patient's imaging study as well as noted the radiology interpretation:  Chest x-ray: No acute cardiopulmonary process  Official radiology report(s): DG Chest 2 View Result Date: 08/11/2023 CLINICAL DATA:  Chest pain. EXAM: CHEST - 2 VIEW COMPARISON:  05/02/2020 FINDINGS: The cardiomediastinal contours are normal. The lungs are clear. Pulmonary vasculature is normal. No consolidation, pleural effusion, or pneumothorax. No acute osseous abnormalities are seen. IMPRESSION: No active cardiopulmonary disease. Electronically Signed   By: Narda Rutherford M.D.   On: 08/11/2023 23:57     PROCEDURES:  Critical Care performed: No  .1-3 Lead EKG Interpretation  Performed by: Irean Hong, MD Authorized by: Irean Hong, MD     Interpretation: normal     ECG rate:  90   ECG rate assessment: normal     Rhythm: sinus rhythm     Ectopy: none     Conduction: normal   Comments:     Patient placed on cardiac monitor to evaluate for arrhythmias    MEDICATIONS ORDERED IN ED: Medications - No data to display   IMPRESSION / MDM / ASSESSMENT AND PLAN / ED COURSE  I reviewed the triage vital signs and the nursing notes.                             27 year old female presenting with chest pain. Differential diagnosis includes, but is not limited to, ACS, aortic dissection, pulmonary embolism, cardiac tamponade, pneumothorax, pneumonia, pericarditis, myocarditis, GI-related causes including esophagitis/gastritis, and musculoskeletal chest wall pain.   I personally reviewed patient's  records and note a GYN visit on 06/09/2023 for vaginitis.  Patient's presentation is most consistent with acute complicated illness / injury requiring diagnostic workup.  Laboratory results demonstrate normal WBC 5.8, unremarkable electrolytes, 2 sets of negative troponins.  Urine pregnancy test was positive, beta-hCG 34.  Patient reports she is not pregnant and is currently on her period.  Patient has no concerns regarding pregnancy.  Given pleuritic nature of  patient's chest pain, will obtain D-dimer and proceed with CTA chest if positive.  Clinical Course as of 08/12/23 0604  Tue Aug 12, 2023  0603 D-dimer is negative.  Patient voicing no complaints of chest pain or shortness of breath.  Will discharge home with follow-up with her PCP.  Strict return precautions given.  Patient verbalizes understanding and agrees with plan of care. [JS]    Clinical Course User Index [JS] Irean Hong, MD     FINAL CLINICAL IMPRESSION(S) / ED DIAGNOSES   Final diagnoses:  Nonspecific chest pain     Rx / DC Orders   ED Discharge Orders     None        Note:  This document was prepared using Dragon voice recognition software and may include unintentional dictation errors.   Irean Hong, MD 08/12/23 [phone]

## 2024-02-02 ENCOUNTER — Ambulatory Visit (INDEPENDENT_AMBULATORY_CARE_PROVIDER_SITE_OTHER): Admitting: Family Medicine

## 2024-02-02 VITALS — BP 128/68 | HR 64 | Temp 98.1°F | Ht 63.0 in | Wt 193.0 lb

## 2024-02-02 DIAGNOSIS — F419 Anxiety disorder, unspecified: Secondary | ICD-10-CM | POA: Diagnosis not present

## 2024-02-02 DIAGNOSIS — Z7689 Persons encountering health services in other specified circumstances: Secondary | ICD-10-CM

## 2024-02-02 DIAGNOSIS — Z6834 Body mass index (BMI) 34.0-34.9, adult: Secondary | ICD-10-CM

## 2024-02-02 DIAGNOSIS — E6609 Other obesity due to excess calories: Secondary | ICD-10-CM

## 2024-02-02 DIAGNOSIS — E66811 Obesity, class 1: Secondary | ICD-10-CM

## 2024-02-02 DIAGNOSIS — R079 Chest pain, unspecified: Secondary | ICD-10-CM | POA: Insufficient documentation

## 2024-02-02 DIAGNOSIS — L918 Other hypertrophic disorders of the skin: Secondary | ICD-10-CM | POA: Diagnosis not present

## 2024-02-02 NOTE — Patient Instructions (Addendum)
-  It was nice to meet you and look forward to taking care of you. -EKG completed today with acute findings. -Ordered labs. Office will call with lab results and results will be available on MyChart.  -Continue seeing therapist. Follow up if anxiety becomes unmanageable.  -If your chest pain with other symptoms occur during the night and become worse-go directly to the closes ED.  -Placed a referral to dermatology for skin tag. Please call the office or send a MyChart message if you do not receive a phone call or a MyChart message about appointment in 2 weeks.  -Follow up in 1 year for physical.

## 2024-02-02 NOTE — Assessment & Plan Note (Signed)
 Dermatology referral sent.

## 2024-02-02 NOTE — Progress Notes (Signed)
 New Patient Office Visit  Subjective    Patient ID: Faith Gonzales, female    DOB: Feb 22, 1996  Age: 28 y.o. MRN: [id number]  CC:  Chief Complaint  Patient presents with   Establish Care    HPI Faith Gonzales presents to establish care  [facility]  Specialist: [place]- [facility]  Covid-19: Lincoln Renshaw and Lincoln Renshaw- does not participate in covid boosters.   Intermittent mid chest pain. Patient has previously been [facility] ED on 08/12/2023. She said initially these chest pains started in October, usually occurring once a month when preparing for bed or during the night with waking her up. On this particular night, she was taking a shower chest pain followed by lightheadedness and SHOB lasting 2-3 hours. Usually pain is rated 7-8/10 lasting few minutes. Described stabbing or squeezing radiating to lower back. She reports she normally does not have anxiety before the episodes, but usually gets anxiety after the episodes start.   Anxiety- Started August 2024. Dad diagnosis of cancer and working as a Optometrist. Goes to therapy private therapist. Not taking any medication.   Outpatient Encounter Medications as of 02/02/2024  Medication Sig   metroNIDAZOLE  (FLAGYL ) 500 MG tablet Take 1 tablet (500 mg total) by mouth 2 (two) times daily.   valACYclovir  (VALTREX ) 500 MG tablet Take 500 mg by mouth 2 (two) times daily.   No facility-administered encounter medications on file as of 02/02/2024.    Past Medical History:  Diagnosis Date   Medical history non-contributory    No known health problems     Past Surgical History:  Procedure Laterality Date   CESAREAN SECTION  04/21/2021   Procedure: CESAREAN SECTION;  Surgeon: Kris Pester, MD;  Location: [facility] ORS;  Service: Obstetrics;;   NO PAST SURGERIES     WISDOM TOOTH EXTRACTION  12/09/2019    Family History  Problem Relation Age of Onset   Diabetes Mother     Prostate cancer Father    Breast cancer Paternal Grandmother     Social History   Socioeconomic History   Marital status: Single    Spouse name: Not on file   Number of children: Not on file   Years of education: Not on file   Highest education level: Bachelor's degree (e.g., BA, AB, BS)  Occupational History   Not on file  Tobacco Use   Smoking status: Never   Smokeless tobacco: Never  Vaping Use   Vaping status: Never Used  Substance and Sexual Activity   Alcohol use: Not Currently    Comment: occ   Drug use: Never   Sexual activity: Not Currently    Birth control/protection: None  Other Topics Concern   Not on file  Social History Narrative   28 years old [name] at home    Social Drivers of [employer] Strain: Low Risk  (02/02/2024)   Overall Financial Resource Strain (CARDIA)    Difficulty of Paying Living Expenses: Not very hard  Food Insecurity: No Food Insecurity (02/02/2024)   Hunger Vital Sign    Worried About Running Out of Food in the Last Year: Never true    Ran Out of Food in the Last Year: Never true  Transportation Needs: No Transportation Needs (02/02/2024)   PRAPARE - Administrator, Civil Service (Medical): No    Lack of Transportation (Non-Medical): No  Physical Activity: Sufficiently Active (02/02/2024)   Exercise Vital Sign  Days of Exercise per Week: 4 days    Minutes of Exercise per Session: 60 min  Stress: Stress Concern Present (02/02/2024)   [employer] of Occupational Health - Occupational Stress Questionnaire    Feeling of Stress : Rather much  Social Connections: Moderately Isolated (02/02/2024)   Social Connection and Isolation Panel [NHANES]    Frequency of Communication with Friends and Family: More than three times a week    Frequency of Social Gatherings with Friends and Family: Three times a week    Attends Religious Services: 1 to 4 times per year    Active Member of Clubs or Organizations:  No    Attends Banker Meetings: Not on file    Marital Status: Never married  Intimate Partner Violence: Not At Risk (02/02/2024)   Humiliation, Afraid, Rape, and Kick questionnaire    Fear of Current or Ex-Partner: No    Emotionally Abused: No    Physically Abused: No    Sexually Abused: No    Review of Systems  Constitutional:  Negative for malaise/fatigue.  Respiratory:  Negative for shortness of breath.   Cardiovascular:  Negative for chest pain and palpitations.  Gastrointestinal:  Negative for constipation, diarrhea, heartburn, nausea and vomiting.  Genitourinary:  Negative for dysuria.  Neurological:  Negative for dizziness and headaches.  Psychiatric/Behavioral:  The patient is nervous/anxious.      Objective   BP 128/68   Pulse 64   Temp 98.1 F (36.7 C) (Oral)   Ht 5\' 3"  (1.6 m)   Wt 193 lb (87.5 kg)   LMP 01/15/2024 (Approximate)   SpO2 98%   BMI 34.19 kg/m   Physical Exam Constitutional:      Appearance: Normal appearance.  HENT:     Head: Normocephalic and atraumatic.     Mouth/Throat:     Mouth: Mucous membranes are moist.  Eyes:     Extraocular Movements: Extraocular movements intact.     Conjunctiva/sclera: Conjunctivae normal.  Cardiovascular:     Rate and Rhythm: Normal rate and regular rhythm.     Pulses: Normal pulses.     Heart sounds: Normal heart sounds.  Pulmonary:     Effort: Pulmonary effort is normal.     Breath sounds: Normal breath sounds.  Musculoskeletal:        General: Normal range of motion.     Cervical back: Normal range of motion.  Skin:    General: Skin is warm.     Capillary Refill: Capillary refill takes less than 2 seconds.     Comments: Large skin tag upper buttock, chaperoned present.   Neurological:     General: No focal deficit present.     Mental Status: She is alert and oriented to person, place, and time.  Psychiatric:        Mood and Affect: Mood normal.        Behavior: Behavior normal.   EKG  completed due to intermittent chest pain. Sinus rhythm with no ST elevation, HR 61. About the same compared to previous EKG on 08/13/2023.     Assessment & Plan:   Problem List Items Addressed This Visit       Musculoskeletal and Integument   Skin tag   -Dermatology referral sent.       Relevant Orders   Ambulatory referral to Dermatology     Other   Chest pain - Primary   -Labs ordered- CBC, CMP, TSH, A1C, lipid and magnesium. -EKG performed  - If  labs are normal, consider holter monitor and possible referral to cardiologist.       Relevant Orders   CBC with Differential/Platelets   CMP   Magnesium   TSH   EKG 12-Lead (Completed)   Anxiety   -Continue seeing therapist. -Follow-up if anxiety becomes unmanageable.      Relevant Orders   TSH   Other Visit Diagnoses       Class 1 obesity due to excess calories without serious comorbidity with body mass index (BMI) of 34.0 to 34.9 in adult       Relevant Orders   CBC with Differential/Platelets   CMP   TSH   Hemoglobin A1c   Lipid Panel     Encounter to establish care          -EKG completed today with acute findings. -Ordered labs (CBC, CMP, Magnesium, TSH, A1c, Lipid panel-not fasting) based on chest pain, BMI-obesity. -If labs are stable, recommend an echo and stress test.  -Continue seeing therapist. Follow up if anxiety becomes unmanageable.  -Advised if her chest pain with other symptoms occur during the night and become worse-go directly to the closes ED.  -Placed a referral to dermatology for skin tag.  -Follow up in 1 year for physical.   JoAnna Williamson, NP

## 2024-02-02 NOTE — Assessment & Plan Note (Signed)
-  Labs ordered- CBC, CMP, TSH, A1C, lipid and magnesium. -EKG performed  - If labs are normal, consider holter monitor and possible referral to cardiologist.

## 2024-02-02 NOTE — Assessment & Plan Note (Signed)
-  Continue seeing therapist. -Follow-up if anxiety becomes unmanageable.

## 2024-02-03 LAB — CBC WITH DIFFERENTIAL/PLATELET
Basophils Absolute: 0 10*3/uL (ref 0.0–0.1)
Basophils Relative: 0.5 % (ref 0.0–3.0)
Eosinophils Absolute: 0.3 10*3/uL (ref 0.0–0.7)
Eosinophils Relative: 5.3 % — ABNORMAL HIGH (ref 0.0–5.0)
HCT: 41.3 % (ref 36.0–46.0)
Hemoglobin: 13.9 g/dL (ref 12.0–15.0)
Lymphocytes Relative: 42.1 % (ref 12.0–46.0)
Lymphs Abs: 2.2 10*3/uL (ref 0.7–4.0)
MCHC: 33.7 g/dL (ref 30.0–36.0)
MCV: 92.8 fl (ref 78.0–100.0)
Monocytes Absolute: 0.2 10*3/uL (ref 0.1–1.0)
Monocytes Relative: 4.4 % (ref 3.0–12.0)
Neutro Abs: 2.5 10*3/uL (ref 1.4–7.7)
Neutrophils Relative %: 47.7 % (ref 43.0–77.0)
Platelets: 229 10*3/uL (ref 150.0–400.0)
RBC: 4.45 Mil/uL (ref 3.87–5.11)
RDW: 13.6 % (ref 11.5–15.5)
WBC: 5.2 10*3/uL (ref 4.0–10.5)

## 2024-02-03 LAB — MAGNESIUM: Magnesium: 2.2 mg/dL (ref 1.5–2.5)

## 2024-02-03 LAB — LIPID PANEL
Cholesterol: 171 mg/dL (ref 0–200)
HDL: 66.2 mg/dL (ref >39.00)
LDL Cholesterol: 89 mg/dL (ref 0–99)
NonHDL: 104.61
Total CHOL/HDL Ratio: 3
Triglycerides: 76 mg/dL (ref 0.0–149.0)
VLDL: 15.2 mg/dL (ref 0.0–40.0)

## 2024-02-03 LAB — COMPREHENSIVE METABOLIC PANEL WITH GFR
ALT: 20 U/L (ref 0–35)
AST: 23 U/L (ref 0–37)
Albumin: 4.7 g/dL (ref 3.5–5.2)
Alkaline Phosphatase: 53 U/L (ref 39–117)
BUN: 15 mg/dL (ref 6–23)
CO2: 30 meq/L (ref 19–32)
Calcium: 10.3 mg/dL (ref 8.4–10.5)
Chloride: 100 meq/L (ref 96–112)
Creatinine, Ser: 0.85 mg/dL (ref 0.40–1.20)
GFR: 93.76 mL/min (ref >60.00)
Glucose, Bld: 77 mg/dL (ref 70–99)
Potassium: 3.8 meq/L (ref 3.5–5.1)
Sodium: 137 meq/L (ref 135–145)
Total Bilirubin: 0.7 mg/dL (ref 0.2–1.2)
Total Protein: 8 g/dL (ref 6.0–8.3)

## 2024-02-03 LAB — HEMOGLOBIN A1C: Hgb A1c MFr Bld: 5.6 % (ref 4.6–6.5)

## 2024-02-05 LAB — TSH: TSH: 2.38 u[IU]/mL (ref 0.35–5.50)

## 2024-02-06 ENCOUNTER — Other Ambulatory Visit: Payer: Self-pay | Admitting: Family Medicine

## 2024-02-06 ENCOUNTER — Ambulatory Visit: Payer: Self-pay | Admitting: Family Medicine

## 2024-02-06 DIAGNOSIS — R079 Chest pain, unspecified: Secondary | ICD-10-CM

## 2024-03-03 ENCOUNTER — Encounter (HOSPITAL_COMMUNITY): Payer: Self-pay | Admitting: *Deleted

## 2024-03-08 ENCOUNTER — Telehealth (HOSPITAL_COMMUNITY): Payer: Self-pay

## 2024-03-08 NOTE — Telephone Encounter (Signed)
 Spoke with the patient, detailed instructions given. S.Icela Glymph CCT

## 2024-03-09 ENCOUNTER — Ambulatory Visit (INDEPENDENT_AMBULATORY_CARE_PROVIDER_SITE_OTHER): Admitting: Family Medicine

## 2024-03-09 ENCOUNTER — Encounter: Payer: Self-pay | Admitting: Family Medicine

## 2024-03-09 VITALS — BP 108/70 | HR 78 | Temp 98.4°F | Ht 64.0 in | Wt 193.0 lb

## 2024-03-09 DIAGNOSIS — Z0000 Encounter for general adult medical examination without abnormal findings: Secondary | ICD-10-CM

## 2024-03-09 NOTE — Progress Notes (Signed)
 Complete physical exam  Patient: Faith Gonzales   DOB: 1996-02-27   28 y.o. Female  MRN: [id number]  Subjective:    Chief Complaint  Patient presents with   Annual Exam    Faith Gonzales is a 28 y.o. female who presents today for a complete physical exam. She reports consuming a low carb, no meat, and more vegetable diet. Walking 2-3 times a week, 2-3 miles.  She generally feels well. She reports sleeping not well. She does not have additional problems to discuss today.    Most recent fall risk assessment:    03/09/2024    8:05 AM  Fall Risk   Falls in the past year? 0  Number falls in past yr: 0  Injury with Fall? 0  Risk for fall due to : No Fall Risks  Follow up Falls evaluation completed     Most recent depression screenings:    03/09/2024    8:05 AM 02/02/2024    2:34 PM  PHQ 2/9 Scores  PHQ - 2 Score 1 0  PHQ- 9 Score 8 5    Vision:Within last year and Dental: No current dental problems and Receives regular dental care  Past Medical History:  Diagnosis Date   Medical history non-contributory    No known health problems    Past Surgical History:  Procedure Laterality Date   CESAREAN SECTION  04/21/2021   Procedure: CESAREAN SECTION;  Surgeon: Leonce Garnette JONETTA, MD;  Location: [facility] ORS;  Service: Obstetrics;;   NO PAST SURGERIES     WISDOM TOOTH EXTRACTION  12/09/2019   Social History   Tobacco Use   Smoking status: Never   Smokeless tobacco: Never  Vaping Use   Vaping status: Never Used  Substance Use Topics   Alcohol use: Not Currently    Comment: occ   Drug use: Never   Social History   Socioeconomic History   Marital status: Single    Spouse name: Not on file   Number of children: Not on file   Years of education: Not on file   Highest education level: Bachelor's degree (e.g., BA, AB, BS)  Occupational History   Not on file  Tobacco Use   Smoking status: Never   Smokeless tobacco: Never  Vaping Use   Vaping status: Never Used   Substance and Sexual Activity   Alcohol use: Not Currently    Comment: occ   Drug use: Never   Sexual activity: Not Currently    Birth control/protection: None  Other Topics Concern   Not on file  Social History Narrative   28 years old Little boy [place] at home    Social Drivers of Health   Financial Resource Strain: Low Risk  (03/08/2024)   Overall Financial Resource Strain (CARDIA)    Difficulty of Paying Living Expenses: Not very hard  Food Insecurity: No Food Insecurity (03/08/2024)   Hunger Vital Sign    Worried About Running Out of Food in the Last Year: Never true    Ran Out of Food in the Last Year: Never true  Transportation Needs: No Transportation Needs (03/08/2024)   PRAPARE - Administrator, Civil Service (Medical): No    Lack of Transportation (Non-Medical): No  Physical Activity: Insufficiently Active (03/08/2024)   Exercise Vital Sign    Days of Exercise per Week: 3 days    Minutes of Exercise per Session: 40 min  Stress: Stress Concern Present (03/08/2024)   [employer] of Occupational  Health - Occupational Stress Questionnaire    Feeling of Stress: Very much  Social Connections: Moderately Isolated (03/08/2024)   Social Connection and Isolation Panel    Frequency of Communication with Friends and Family: More than three times a week    Frequency of Social Gatherings with Friends and Family: Twice a week    Attends Religious Services: 1 to 4 times per year    Active Member of [employer] or Organizations: No    Attends Engineer, structural: Not on file    Marital Status: Never married  Intimate Partner Violence: Not At Risk (02/02/2024)   Humiliation, Afraid, Rape, and Kick questionnaire    Fear of Current or Ex-Partner: No    Emotionally Abused: No    Physically Abused: No    Sexually Abused: No   Family Status  Relation Name Status   Mother  Alive   Father  Alive   Sister  Alive   PGM  Deceased  No partnership data on file    Family History  Problem Relation Age of Onset   Diabetes Mother    Prostate cancer Father    Anxiety disorder Sister    Breast cancer Paternal Grandmother    No Known Allergies    Patient Care Team: Billy Philippe SAUNDERS, NP as PCP - General (Family Medicine) Justino Eleanor HERO, CNM as Midwife (Obstetrics)   Outpatient Medications Prior to Visit  Medication Sig   valACYclovir  (VALTREX ) 500 MG tablet Take 500 mg by mouth 2 (two) times daily.   [DISCONTINUED] metroNIDAZOLE  (FLAGYL ) 500 MG tablet Take 1 tablet (500 mg total) by mouth 2 (two) times daily.   No facility-administered medications prior to visit.  Review of Systems  Constitutional: Negative.   HENT: Negative.    Eyes: Negative.   Respiratory: Negative.    Cardiovascular: Negative.   Gastrointestinal: Negative.   Genitourinary: Negative.   Skin:  Positive for rash (Right arm pit).  Neurological: Negative.   Psychiatric/Behavioral:  The patient is nervous/anxious and has insomnia.    See HPI above    Objective:   BP 108/70   Pulse 78   Temp 98.4 F (36.9 C) (Oral)   Ht 5' 4 (1.626 m)   Wt 193 lb (87.5 kg)   LMP 02/21/2024 (Approximate)   SpO2 97%   BMI 33.13 kg/m    Physical Exam Vitals reviewed.  Constitutional:      General: She is not in acute distress.    Appearance: Normal appearance. She is obese. She is not ill-appearing, toxic-appearing or diaphoretic.  HENT:     Head: Normocephalic and atraumatic.     Right Ear: Tympanic membrane, ear canal and external ear normal. There is no impacted cerumen.     Left Ear: Tympanic membrane, ear canal and external ear normal. There is no impacted cerumen.     Nose:     Right Sinus: No maxillary sinus tenderness or frontal sinus tenderness.     Left Sinus: No maxillary sinus tenderness or frontal sinus tenderness.     Mouth/Throat:     Mouth: Mucous membranes are moist.     Pharynx: Oropharynx is clear. Uvula midline. No pharyngeal swelling,  oropharyngeal exudate, posterior oropharyngeal erythema, uvula swelling or postnasal drip.  Eyes:     General:        Right eye: No discharge.        Left eye: No discharge.     Conjunctiva/sclera: Conjunctivae normal.     Pupils: Pupils are  equal, round, and reactive to light.     Comments: Wearing glasses   Neck:     Thyroid: No thyromegaly.  Cardiovascular:     Rate and Rhythm: Normal rate and regular rhythm.     Pulses:          Posterior tibial pulses are 2+ on the right side and 2+ on the left side.     Heart sounds: Normal heart sounds. No murmur heard.    No friction rub. No gallop.  Pulmonary:     Effort: Pulmonary effort is normal. No respiratory distress.     Breath sounds: Normal breath sounds.  Abdominal:     General: Abdomen is flat. Bowel sounds are normal. There is no distension.     Palpations: Abdomen is soft. There is no mass.     Tenderness: There is no abdominal tenderness. There is no right CVA tenderness or left CVA tenderness.  Musculoskeletal:        General: Normal range of motion.     Cervical back: Normal range of motion.     Right lower leg: No edema.     Left lower leg: No edema.  Skin:    General: Skin is warm and dry.  Neurological:     General: No focal deficit present.     Mental Status: She is alert and oriented to person, place, and time. Mental status is at baseline.     Motor: No weakness.     Gait: Gait normal.  Psychiatric:        Mood and Affect: Mood normal.        Behavior: Behavior normal.        Thought Content: Thought content normal.        Judgment: Judgment normal.      No results found for any visits on 03/09/24. Last CBC Lab Results  Component Value Date   WBC 5.2 02/02/2024   HGB 13.9 02/02/2024   HCT 41.3 02/02/2024   MCV 92.8 02/02/2024   MCH 33.0 08/11/2023   RDW 13.6 02/02/2024   PLT 229.0 02/02/2024   Last metabolic panel Lab Results  Component Value Date   GLUCOSE 77 02/02/2024   NA 137 02/02/2024    K 3.8 02/02/2024   CL 100 02/02/2024   CO2 30 02/02/2024   BUN 15 02/02/2024   CREATININE 0.85 02/02/2024   GFR 93.76 02/02/2024   CALCIUM 10.3 02/02/2024   PROT 8.0 02/02/2024   ALBUMIN 4.7 02/02/2024   BILITOT 0.7 02/02/2024   ALKPHOS 53 02/02/2024   AST 23 02/02/2024   ALT 20 02/02/2024   ANIONGAP 10 08/11/2023   Last lipids Lab Results  Component Value Date   CHOL 171 02/02/2024   HDL 66.20 02/02/2024   LDLCALC 89 02/02/2024   TRIG 76.0 02/02/2024   CHOLHDL 3 02/02/2024   Last hemoglobin A1c Lab Results  Component Value Date   HGBA1C 5.6 02/02/2024   Last thyroid functions Lab Results  Component Value Date   TSH 2.38 02/02/2024        Assessment & Plan:    Routine Health Maintenance and Physical Exam  Immunization History  Administered Date(s) Administered   Influenza,inj,Quad PF,6+ Mos 07/10/2022   PPD Test 02/11/2020, 11/06/2020, 11/20/2020   Tdap 02/14/2021    Health Maintenance  Topic Date Due   COVID-19 Vaccine (1) Never done   Hepatitis B Vaccines (1 of 3 - 19+ 3-dose series) Never done   HPV VACCINES (1 - Risk 3-dose  SCDM series) Never done   INFLUENZA VACCINE  03/26/2024   Cervical Cancer Screening (Pap smear)  05/31/2024   DTaP/Tdap/Td (2 - Td or Tdap) 02/15/2031   Hepatitis C Screening  Completed   HIV Screening  Completed   Meningococcal B Vaccine  Aged Out    Discussed health benefits of physical activity, and encouraged her to engage in regular exercise appropriate for her age and condition.  Annual physical exam  1.Review health maintenance: -Covid booster: Declines  -Hep B vaccines: Unknown  -HPV vaccine: Unknown  2.Physical exam completed today. No concerns. 3.Follow up in 2 weeks to discuss insomnia and anxiety.  Return in about 2 weeks (around 03/23/2024) for follow-up.     Kymari Nuon, NP

## 2024-03-09 NOTE — Patient Instructions (Signed)
-  Physical exam completed today. No concerns. -Follow up in 2 weeks to discuss insomnia and anxiety.  -Take care.

## 2024-03-10 ENCOUNTER — Other Ambulatory Visit: Payer: Self-pay | Admitting: Family Medicine

## 2024-03-10 DIAGNOSIS — R079 Chest pain, unspecified: Secondary | ICD-10-CM

## 2024-03-12 ENCOUNTER — Ambulatory Visit (HOSPITAL_COMMUNITY)
Admission: RE | Admit: 2024-03-12 | Discharge: 2024-03-12 | Disposition: A | Discharge status: Home / Self Care | Source: Ambulatory Visit | Attending: Cardiology | Admitting: Cardiology

## 2024-03-12 DIAGNOSIS — R079 Chest pain, unspecified: Secondary | ICD-10-CM | POA: Diagnosis present

## 2024-03-12 MED ORDER — PERFLUTREN LIPID MICROSPHERE
6.0000 mL | INTRAVENOUS | Status: DC | PRN
  Administered 2024-03-12: 6 mL via INTRAVENOUS

## 2024-03-15 ENCOUNTER — Ambulatory Visit: Payer: Self-pay | Admitting: Family Medicine

## 2024-03-15 MED FILL — Perflutren Lipid Microsphere IV Susp 1.1 MG/ML: INTRAVENOUS | Qty: 6 | Status: AC

## 2024-03-30 ENCOUNTER — Ambulatory Visit: Admitting: Family Medicine

## 2024-03-30 ENCOUNTER — Encounter: Payer: Self-pay | Admitting: Family Medicine

## 2024-03-30 VITALS — BP 118/78 | HR 61 | Temp 98.5°F | Ht 64.0 in | Wt 188.0 lb

## 2024-03-30 DIAGNOSIS — F32A Depression, unspecified: Secondary | ICD-10-CM | POA: Diagnosis not present

## 2024-03-30 DIAGNOSIS — F419 Anxiety disorder, unspecified: Secondary | ICD-10-CM

## 2024-03-30 MED ORDER — FLUOXETINE HCL 20 MG PO CAPS: 20.0000 mg | ORAL_CAPSULE | Freq: Every day | ORAL | 0 refills | Status: DC

## 2024-03-30 NOTE — Patient Instructions (Signed)
-  It was good to see you today.  -Prescribed Fluoxetine  20mg  tablet, take 1 tablet daily for anxiety and depression. Recommend to start taking medication at bedtime to help with side effects that can occur when initially starting medication. -Discussed about when to seek emergent medical help if suicidal or homicidal occurred.  -Encourage to find a counselor through insurance.  -Follow up in 1 month.

## 2024-03-30 NOTE — Progress Notes (Signed)
   Established Patient Office Visit   Subjective:  Patient ID: Faith Gonzales, female    DOB: 1995/10/26  Age: 28 y.o. MRN: [id number]  Chief Complaint  Patient presents with   Medical Management of Chronic Issues    2 week follow up    HPI Patient is here to discuss anxiety. She reports she started having symptoms of anxiety in August 2024. She reports her mind is racing, constantly thinking about what she has to do, general health of her family members, and worrying about things that she may not be able to control.  She feels the sharp chest pain instantly when very stressed or sudden changes. For instances, before having her stress test, that was normal, she had found out a classmate passed away, had chest pain then. She reports she has noticed that the chest pain is most likely related to anxiety. During August 2024, she felt more depressed, crying more, and having mini break downs. Still having those episodes of crying, but not as often.   Never took medication for anxiety or depression. She reports she did go to therapy back in October 2024 to December 2024. Had to stop due to insurance. Therapist had recommended medication management for depression and anxiety.   ROS See HPI above     Objective:   BP 118/78   Pulse 61   Temp 98.5 F (36.9 C) (Oral)   Ht 5' 4 (1.626 m)   Wt 188 lb (85.3 kg)   LMP 02/21/2024 (Approximate)   SpO2 98%   BMI 32.27 kg/m    Physical Exam Vitals reviewed.  Constitutional:      General: She is not in acute distress.    Appearance: Normal appearance. She is not ill-appearing, toxic-appearing or diaphoretic.  HENT:     Head: Atraumatic.  Eyes:     General:        Right eye: No discharge.        Left eye: No discharge.     Conjunctiva/sclera: Conjunctivae normal.  Cardiovascular:     Rate and Rhythm: Normal rate.  Pulmonary:     Effort: Pulmonary effort is normal. No respiratory distress.  Musculoskeletal:        General: Normal range  of motion.  Skin:    General: Skin is warm and dry.  Neurological:     General: No focal deficit present.     Mental Status: She is alert and oriented to person, place, and time. Mental status is at baseline.  Psychiatric:        Mood and Affect: Mood is anxious and depressed.        Behavior: Behavior normal.        Thought Content: Thought content normal.        Judgment: Judgment normal.      Assessment & Plan:  Anxiety and depression -     FLUoxetine  HCl; Take 1 capsule (20 mg total) by mouth daily.  Dispense: 90 capsule; Refill: 0  -Patient scored 16 on PHQ-9 and 15 on GAD-7.  -Prescribed Fluoxetine  20mg  tablet, take 1 tablet daily for anxiety and depression. Recommend to start taking medication at bedtime to help with side effects that can occur when initially starting medication. -Discussed about when to seek emergent medical help if suicidal or homicidal occurred.  -Encourage to find a Veterinary surgeon through insurance.   Return in about 1 month (around 04/30/2024) for follow-up telehealth or in person. .   Meghana Tullo, NP

## 2024-04-12 ENCOUNTER — Other Ambulatory Visit (HOSPITAL_COMMUNITY)
Admission: RE | Admit: 2024-04-12 | Discharge: 2024-04-12 | Disposition: A | Discharge status: Home / Self Care | Source: Ambulatory Visit | Attending: Obstetrics | Admitting: Obstetrics

## 2024-04-12 ENCOUNTER — Ambulatory Visit

## 2024-04-12 VITALS — BP 106/68 | HR 70 | Ht 63.0 in | Wt 185.2 lb

## 2024-04-12 DIAGNOSIS — Z113 Encounter for screening for infections with a predominantly sexual mode of transmission: Secondary | ICD-10-CM

## 2024-04-12 DIAGNOSIS — N760 Acute vaginitis: Secondary | ICD-10-CM | POA: Diagnosis not present

## 2024-04-12 NOTE — Progress Notes (Addendum)
    NURSE VISIT NOTE  Subjective:    Patient ID: Faith Gonzales, female    DOB: [birth year], 28 y.o.   MRN: [id number]  HPI  Patient is a 28 y.o. G41P1021 female who presents for routine STD testing. Denies abnormal vaginal bleeding or significant pelvic pain or fever. denies UTI Symptoms. Patient denies history of known exposure to STD.   Objective:    BP 106/68   Pulse 70   Ht 5' 3 (1.6 m)   Wt 185 lb 3.2 oz (84 kg)   BMI 32.81 kg/m      Assessment:   1. Routine screening for STI (sexually transmitted infection)     nonspecific vaginitis  Plan:   GC and chlamydia DNA  probe sent to lab. Treatment: Will await test results. ROV prn if symptoms persist or worsen.   Camelia Bars, LPN

## 2024-04-12 NOTE — Patient Instructions (Signed)
 How to Have Safe Sex Having safe sex means taking steps before and during sex to reduce your risk of: Getting a sexually transmitted infection (STI). Giving your partner an STI. Unwanted or unplanned pregnancy. How to have safe sex Ways you can have safe sex  Limit your sex partners to only one partner who is only having sex with you. Avoid using alcohol and drugs before having sex. Alcohol and drugs can affect your judgment. Before having sex with a new partner: Talk to your partner about past partners, past STIs, and drug use. Get screened for STIs and discuss the results with your partner. Ask your partner to get screened too. Check your body regularly for sores, blisters, rashes, or unusual discharge. If you notice any of these things, call your health care provider. Avoid sexual contact if you have symptoms of an infection or you're being treated for an STI. While having sex, use a condom. Make sure to: Use a condom every time you have vaginal, oral, or anal sex. Both females and males should wear condoms during oral sex. Do not use a female condom and a female condom at the same time during vaginal sex. Using both types at the same time can cause condoms to break. Keep condoms in place from the beginning to the end of sexual activity. Use a latex condom, if possible. Latex condoms offer the best protection. Use only water-based lubricants with a condom. Using petroleum-based lubricants or oils will weaken the condom and increase the chance that it will break. Ways your health care provider can help you have safe sex  See your provider for regular screenings, exams, and tests for STIs. Talk with your provider about what kind of birth control is best for you. Get vaccinated against hepatitis B and human papillomavirus (HPV). If you're at risk of getting human immunodeficiency virus (HIV), talk with your provider about taking a medicine to prevent HIV. You're at risk for HIV if you: Are  a female who has sex with other males. Are sexually active with more than one partner. Take drugs by injection. Have a sex partner who has HIV. Have unprotected sex. Have sex with someone who has sex with both males and females. Follow these instructions at home: Take your medicines only as told. Call your provider if you think you might be pregnant. Call your provider if have any symptoms of an infection. Where to find more information Centers for Disease Control and Prevention (CDC): [employer] Office on Women's Health: [employer].ca This information is not intended to replace advice given to you by your health care provider. Make sure you discuss any questions you have with your health care provider. Document Revised: 01/01/2023 Document Reviewed: 01/01/2023 Elsevier Patient Education  2024 [employer].

## 2024-04-13 LAB — HEPATITIS B SURFACE ANTIGEN: Hepatitis B Surface Ag: NEGATIVE

## 2024-04-13 LAB — CERVICOVAGINAL ANCILLARY ONLY
Bacterial Vaginitis (gardnerella): NEGATIVE
Candida Glabrata: NEGATIVE
Candida Vaginitis: NEGATIVE
Chlamydia: NEGATIVE
Comment: NEGATIVE
Comment: NEGATIVE
Comment: NEGATIVE
Comment: NEGATIVE
Comment: NEGATIVE
Comment: NORMAL
Neisseria Gonorrhea: NEGATIVE
Trichomonas: NEGATIVE

## 2024-04-13 LAB — HIV ANTIBODY (ROUTINE TESTING W REFLEX): HIV Screen 4th Generation wRfx: NONREACTIVE

## 2024-04-13 LAB — RPR: RPR Ser Ql: NONREACTIVE

## 2024-04-13 LAB — HEPATITIS C ANTIBODY: Hep C Virus Ab: NONREACTIVE

## 2024-04-30 ENCOUNTER — Encounter: Payer: Self-pay | Admitting: Family Medicine

## 2024-04-30 ENCOUNTER — Telehealth (INDEPENDENT_AMBULATORY_CARE_PROVIDER_SITE_OTHER): Admitting: Family Medicine

## 2024-04-30 VITALS — Ht 64.0 in | Wt 188.0 lb

## 2024-04-30 DIAGNOSIS — F32A Depression, unspecified: Secondary | ICD-10-CM

## 2024-04-30 DIAGNOSIS — F419 Anxiety disorder, unspecified: Secondary | ICD-10-CM

## 2024-04-30 MED ORDER — HYDROXYZINE PAMOATE 25 MG PO CAPS: 25.0000 mg | ORAL_CAPSULE | Freq: Three times a day (TID) | ORAL | 0 refills | Status: AC | PRN

## 2024-04-30 NOTE — Progress Notes (Addendum)
   Established Patient Telehealth Visit.    Subjective:  Patient ID: Faith Gonzales, female    DOB: 1996/08/24  Age: 28 y.o. MRN: [id number]  Chief Complaint  Patient presents with   Medical Management of Chronic Issues    1 month follow up     HPI This visit was completed via telehealth. Patient is in her car while provider was at office of [facility] [place].  Anxiety and Depression: On previous appointment, patient stared Fluoxetine  20mg  tablet daily. She reports it has helped with stress eating. Overall, feels like it helps with general anxiety. Since last visit, had major life changes of starting a job in [place], commuting back and forth, and father placed on hospice.  ROS See HPI above     Objective:   Ht 5' 4 (1.626 m)   Wt 188 lb (85.3 kg)   BMI 32.27 kg/m  Vital signs unable to be obtain due to not having access to MyChart.   Physical Exam Vitals reviewed.  Constitutional:      General: She is not in acute distress.    Appearance: Normal appearance. She is not ill-appearing, toxic-appearing or diaphoretic.  HENT:     Head: Normocephalic and atraumatic.  Eyes:     General:        Right eye: No discharge.        Left eye: No discharge.     Conjunctiva/sclera: Conjunctivae normal.  Pulmonary:     Effort: Pulmonary effort is normal. No respiratory distress.  Skin:    General: Skin is dry.  Neurological:     General: No focal deficit present.     Mental Status: She is alert and oriented to person, place, and time. Mental status is at baseline.  Psychiatric:        Mood and Affect: Mood normal.        Behavior: Behavior normal.        Thought Content: Thought content normal.        Judgment: Judgment normal.      Assessment & Plan:  Anxiety and depression -     hydrOXYzine  Pamoate; Take 1 capsule (25 mg total) by mouth every 8 (eight) hours as needed for up to 5 days for anxiety.  Dispense: 15 capsule; Refill: 0  -Continue with Fluoxetine  20mg  tablet  daily since it has been effective. GAD 7 score has improved to 10 from 16 and PHQ-9 has stayed the same at 8.  -Prescribed Hydroxyzine  25mg  tablet, may take 1 tablet every 8 hours as needed for anxiety attacks/ panic attacks. -Follow up in 1 month for telehealth.   Return in about 1 month (around 05/30/2024) for follow-up-telehealth .   Jalesia Loudenslager, NP

## 2024-04-30 NOTE — Patient Instructions (Signed)
-  It was great to see you today. My thoughts are with you during this season.  -Continue with Fluoxetine  20mg  tablet daily since it has been effective. -Prescribed Hydroxyzine  25mg  tablet, may take 1 tablet every 8 hours as needed for anxiety attacks/ panic attacks. -Follow up in 1 month for telehealth.

## 2024-05-31 ENCOUNTER — Ambulatory Visit: Admitting: Family Medicine

## 2024-06-03 ENCOUNTER — Telehealth (INDEPENDENT_AMBULATORY_CARE_PROVIDER_SITE_OTHER): Admitting: Family Medicine

## 2024-06-03 ENCOUNTER — Encounter: Payer: Self-pay | Admitting: Family Medicine

## 2024-06-03 VITALS — Ht 64.0 in | Wt 188.0 lb

## 2024-06-03 DIAGNOSIS — F32A Depression, unspecified: Secondary | ICD-10-CM

## 2024-06-03 DIAGNOSIS — F419 Anxiety disorder, unspecified: Secondary | ICD-10-CM | POA: Diagnosis not present

## 2024-06-03 MED ORDER — HYDROXYZINE PAMOATE 25 MG PO CAPS: 25.0000 mg | ORAL_CAPSULE | ORAL | 1 refills | Status: AC | PRN

## 2024-06-03 MED ORDER — FLUOXETINE HCL 60 MG PO TABS: 1.0000 | ORAL_TABLET | Freq: Every day | ORAL | 1 refills | Status: DC

## 2024-06-03 NOTE — Progress Notes (Signed)
 Virtual Visit via Video Note  I connected with Faith Gonzales on 06/03/24 at 9:48 AM by a video enabled telemedicine application and verified that I am speaking with the correct person using two identifiers.   Patient Location: Other:  School  Provider Location: office - [place].    I discussed the limitations, risks, security and privacy concerns of performing an evaluation and management service by telephone and the availability of in person appointments. I also discussed with the patient that there may be a patient responsible charge related to this service. The patient expressed understanding and agreed to proceed, consent obtained  Chief Complaint  Patient presents with   Medical Management of Chronic Issues    1 month follow up     History of Present Illness: Faith Gonzales is a 28 y.o. female following up on anxiety and depression. On previous visit, added Hydroxyzine  25mg  tablet every 8 hours PRN for anxiety attacks/panic attacks, along with continuing Fluoxetine  20mg . Patient has been under more stress and depression than last visit. She reports she lost her daddy in Hospice last Sunday. Also, working in and going to school in [place]. 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 Coming home on the weekends to be with her mother. She reports about a week ago, she increased the Fluoxetine  to 40mg , taking (2) 20mg  tablets. However, the Hydroxzyine has helped with sleeping due to anxiety. She usually takes it 3 times a week. Good news is she is getting back reconnected with previously therapist that she liked, [facility] with [facility]. First appointment is tomorrow.   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 Authorizing Provider  FLUoxetine  (PROZAC ) 20 MG capsule Take 1 capsule (20 mg total) by mouth daily. 03/30/24  Yes Billy Philippe SAUNDERS, NP  hydrOXYzine  (VISTARIL ) 25 MG capsule Take 25 mg by mouth every 4 (four) hours as needed for anxiety.   Yes [provider]   Social History   Socioeconomic History   Marital status: Single    Spouse name: Not on file   Number of children: Not on file   Years of education: Not on file   Highest education level: Bachelor's degree (e.g., BA, AB, BS)  Occupational History   Not on file  Tobacco Use   Smoking status: Never   Smokeless tobacco: Never  Vaping Use   Vaping status: Never Used  Substance and Sexual Activity   Alcohol use: Not Currently    Comment: occ   Drug use: Never   Sexual activity: Not Currently    Birth control/protection: None  Other Topics Concern   Not on file  Social History Narrative   28 years old Little boy [place] at home    Social Drivers of Health   Financial Resource Strain: Low Risk  (03/08/2024)   Overall Financial Resource Strain (CARDIA)    Difficulty of Paying Living Expenses: Not very hard  Food Insecurity: No Food Insecurity (03/08/2024)   Hunger Vital Sign    Worried About Running Out of Food in the Last Year: Never true    Ran Out of Food in the Last Year: Never true  Transportation Needs: No  Transportation Needs (03/08/2024)   PRAPARE - Administrator, Civil Service (Medical): No    Lack of Transportation (Non-Medical): No  Physical Activity: Insufficiently Active (03/08/2024)   Exercise Vital Sign    Days of Exercise per Week: 3 days    Minutes of Exercise per Session: 40 min  Stress: Stress Concern Present (03/08/2024)   [employer] of Occupational Health - Occupational Stress Questionnaire    Feeling of Stress: Very much  Social Connections: Moderately Isolated (03/08/2024)   Social Connection and Isolation Panel    Frequency of Communication with Friends and Family: More than three times a week    Frequency of Social Gatherings with Friends and Family: Twice a week    Attends Religious Services: 1 to 4 times per year    Active Member of [employer] or Organizations: No    Attends Banker Meetings: Not on file    Marital Status: Never married  Intimate Partner Violence: Not At Risk (02/02/2024)   Humiliation, Afraid, Rape, and Kick questionnaire    Fear of Current or Ex-Partner: No    Emotionally Abused: No    Physically Abused: No    Sexually Abused: No    Observations/Objective: Today's Vitals   06/03/24 0901  Weight: 188 lb (85.3 kg)  Height: 5' 4 (1.626 m)  Unable to obtain vital signs since equipment was not available for patient.  Physical Exam Vitals reviewed.  Constitutional:      General: She is not in acute distress.    Appearance: Normal appearance. She is not ill-appearing, toxic-appearing or diaphoretic.  Eyes:     General:        Right eye: No discharge.        Left eye: No discharge.     Conjunctiva/sclera: Conjunctivae normal.  Cardiovascular:     Rate and Rhythm: Normal rate.  Pulmonary:     Effort: Pulmonary effort is normal. No respiratory distress.  Skin:    General: Skin is dry.  Neurological:     General: No focal deficit present.     Mental Status: She is alert and oriented to person, place, and time. Mental  status is at baseline.  Psychiatric:        Mood and Affect: Mood is anxious and depressed. Affect is tearful.        Thought Content: Thought content normal.        Judgment: Judgment normal.     Assessment and Plan: Anxiety and depression -     FLUoxetine  HCl; Take 1 tablet by mouth daily.  Dispense: 30 tablet; Refill: 1 -     hydrOXYzine  Pamoate; Take 1 capsule (25 mg total) by mouth every 4 (four) hours as needed for anxiety.  Dispense: 30 capsule; Refill: 1  -With increase anxiety and depression, scored 17 from 10 on GAD-7 and 18 from 8 on PHQ-9, increase Fluoxetine  to 60mg  daily.  If unable to obtain the whole 60mg  tablet, send a MyChart message, will send in a 20mg  and 40mg  tablet to equal 60mg .  -Refilled Hydroxyzine  25mg  tablet.   Follow Up Instructions: Return in about 1 month (around 07/04/2024) for follow-up-telehealth .   I discussed the assessment and treatment plan with the patient. The patient was provided an opportunity to ask questions and all were answered. The patient agreed with the plan and demonstrated an understanding of the instructions.   The patient was advised to call back or seek an in-person evaluation if the symptoms worsen or if the condition fails to improve as anticipated.  Rance Smithson, NP

## 2024-06-03 NOTE — Patient Instructions (Signed)
-  It was great to see you today. I am so sorry for the loss of your father. I am so glad you will be able to reassume your therapy sessions with your previous therapist.  -With increase anxiety and depression, increase Fluoxetine  to 60mg  daily. If unable to obtain the whole 60mg  tablet, send a MyChart message, will send in a 20mg  and 40mg  tablet to equal 60mg .  -Refilled Hydroxyzine  25mg  tablet.  -Follow up in 1 month for telehealth.

## 2024-06-04 DIAGNOSIS — F331 Major depressive disorder, recurrent, moderate: Secondary | ICD-10-CM | POA: Diagnosis not present

## 2024-06-04 DIAGNOSIS — F411 Generalized anxiety disorder: Secondary | ICD-10-CM | POA: Diagnosis not present

## 2024-06-28 DIAGNOSIS — F411 Generalized anxiety disorder: Secondary | ICD-10-CM | POA: Diagnosis not present

## 2024-06-28 DIAGNOSIS — F331 Major depressive disorder, recurrent, moderate: Secondary | ICD-10-CM | POA: Diagnosis not present

## 2024-07-15 DIAGNOSIS — F411 Generalized anxiety disorder: Secondary | ICD-10-CM | POA: Diagnosis not present

## 2024-07-15 DIAGNOSIS — F331 Major depressive disorder, recurrent, moderate: Secondary | ICD-10-CM | POA: Diagnosis not present

## 2024-08-03 ENCOUNTER — Other Ambulatory Visit: Payer: Self-pay | Admitting: Family Medicine

## 2024-08-03 DIAGNOSIS — F419 Anxiety disorder, unspecified: Secondary | ICD-10-CM

## 2024-09-08 ENCOUNTER — Ambulatory Visit: Admitting: Physician Assistant

## 2025-05-11 ENCOUNTER — Ambulatory Visit: Admitting: Physician Assistant
